# Patient Record
Sex: Female | Born: 1990 | Race: White | Hispanic: No | Marital: Married | Smoking: Never smoker
Health system: Southern US, Community
[De-identification: ages and names within clinical notes are randomized; demographics above are authoritative.]

## PROBLEM LIST (undated history)

## (undated) DIAGNOSIS — O1493 Unspecified pre-eclampsia, third trimester: Secondary | ICD-10-CM

## (undated) DIAGNOSIS — B999 Unspecified infectious disease: Secondary | ICD-10-CM

## (undated) DIAGNOSIS — Z8759 Personal history of other complications of pregnancy, childbirth and the puerperium: Secondary | ICD-10-CM

## (undated) DIAGNOSIS — F419 Anxiety disorder, unspecified: Secondary | ICD-10-CM

---

## 2019-08-12 ENCOUNTER — Inpatient Hospital Stay (HOSPITAL_COMMUNITY)
Admission: AD | Admit: 2019-08-12 | Discharge: 2019-08-13 | Disposition: A | Discharge status: Home / Self Care | Payer: Federal, State, Local not specified - PPO | Attending: Obstetrics and Gynecology | Admitting: Obstetrics and Gynecology

## 2019-08-12 ENCOUNTER — Encounter (HOSPITAL_COMMUNITY): Payer: Self-pay

## 2019-08-12 ENCOUNTER — Inpatient Hospital Stay (HOSPITAL_COMMUNITY): Payer: Federal, State, Local not specified - PPO

## 2019-08-12 ENCOUNTER — Other Ambulatory Visit: Payer: Self-pay

## 2019-08-12 DIAGNOSIS — O209 Hemorrhage in early pregnancy, unspecified: Secondary | ICD-10-CM | POA: Diagnosis present

## 2019-08-12 DIAGNOSIS — O418X10 Other specified disorders of amniotic fluid and membranes, first trimester, not applicable or unspecified: Secondary | ICD-10-CM

## 2019-08-12 DIAGNOSIS — O34219 Maternal care for unspecified type scar from previous cesarean delivery: Secondary | ICD-10-CM | POA: Diagnosis not present

## 2019-08-12 DIAGNOSIS — Z3A09 9 weeks gestation of pregnancy: Secondary | ICD-10-CM | POA: Diagnosis not present

## 2019-08-12 DIAGNOSIS — O468X1 Other antepartum hemorrhage, first trimester: Secondary | ICD-10-CM

## 2019-08-12 DIAGNOSIS — N939 Abnormal uterine and vaginal bleeding, unspecified: Secondary | ICD-10-CM

## 2019-08-12 HISTORY — DX: Unspecified infectious disease: B99.9

## 2019-08-12 HISTORY — DX: Anxiety disorder, unspecified: F41.9

## 2019-08-12 HISTORY — DX: Unspecified pre-eclampsia, third trimester: O14.93

## 2019-08-12 LAB — TYPE AND SCREEN
ABO/RH(D): O POS
Antibody Screen: NEGATIVE

## 2019-08-12 LAB — CBC WITH DIFFERENTIAL/PLATELET
Abs Immature Granulocytes: 0.05 10*3/uL (ref 0.00–0.07)
Basophils Absolute: 0 10*3/uL (ref 0.0–0.1)
Basophils Relative: 0 %
Eosinophils Absolute: 0.1 10*3/uL (ref 0.0–0.5)
Eosinophils Relative: 0 %
HCT: 39.6 % (ref 36.0–46.0)
Hemoglobin: 13.6 g/dL (ref 12.0–15.0)
Immature Granulocytes: 0 %
Lymphocytes Relative: 24 %
Lymphs Abs: 3.4 10*3/uL (ref 0.7–4.0)
MCH: 29.1 pg (ref 26.0–34.0)
MCHC: 34.3 g/dL (ref 30.0–36.0)
MCV: 84.6 fL (ref 80.0–100.0)
Monocytes Absolute: 1.1 10*3/uL — ABNORMAL HIGH (ref 0.1–1.0)
Monocytes Relative: 7 %
Neutro Abs: 9.6 10*3/uL — ABNORMAL HIGH (ref 1.7–7.7)
Neutrophils Relative %: 69 %
Platelets: 300 10*3/uL (ref 150–400)
RBC: 4.68 MIL/uL (ref 3.87–5.11)
RDW: 12.2 % (ref 11.5–15.5)
WBC: 14.2 10*3/uL — ABNORMAL HIGH (ref 4.0–10.5)
nRBC: 0 % (ref 0.0–0.2)

## 2019-08-12 LAB — WET PREP, GENITAL
Sperm: NONE SEEN
Trich, Wet Prep: NONE SEEN
Yeast Wet Prep HPF POC: NONE SEEN

## 2019-08-12 LAB — POCT PREGNANCY, URINE: Preg Test, Ur: POSITIVE — AB

## 2019-08-12 NOTE — MAU Provider Note (Signed)
History     [id number]  Arrival date and time: 08/12/19 2030    Chief Complaint  Patient presents with  . Vaginal Bleeding     HPI Laura Wilkerson is a 28 y.o. at 4578w0d by LMP, who presents for vaginal bleeding.   Around 11 AM had some spotting, light pink blood Also had some mild cramps Had intercouse with husband last night  Called OB who recommended monitoring as could be post coital Continued to have spotting throughout day as well as some cramps Unsure if she had any clots Denies vaginal discharge, odor No burning or pain w urination No fevers Mild nausea today but has had throughout this trimester, taking B6  Vaginal bleeding: Yes LOF: No Fetal Movement: No Contractions: No     OB History    Gravida  2   Para  1   Term  1   Preterm      AB      Living  1     SAB      TAB      Ectopic      Multiple      Live Births  1           Past Medical History:  Diagnosis Date  . Anxiety   . Infection    UTI  . Preeclampsia, third trimester     Past Surgical History:  Procedure Laterality Date  . CESAREAN SECTION      No family history on file.  Social History   Socioeconomic History  . Marital status: Married    Spouse name: Not on file  . Number of children: Not on file  . Years of education: Not on file  . Highest education level: Not on file  Occupational History  . Not on file  Social Needs  . Financial resource strain: Not on file  . Food insecurity    Worry: Not on file    Inability: Not on file  . Transportation needs    Medical: Not on file    Non-medical: Not on file  Tobacco Use  . Smoking status: Never Smoker  . Smokeless tobacco: Never Used  Substance and Sexual Activity  . Alcohol use: Never    Frequency: Never  . Drug use: Never  . Sexual activity: Yes    Comment: last sex 19 sep 20  Lifestyle  . Physical activity    Days per week: Not on file    Minutes per session: Not on file  . Stress: Not  on file  Relationships  . Social Musicianconnections    Talks on phone: Not on file    Gets together: Not on file    Attends religious service: Not on file    Active member of club or organization: Not on file    Attends meetings of clubs or organizations: Not on file    Relationship status: Not on file  . Intimate partner violence    Fear of current or ex partner: Not on file    Emotionally abused: Not on file    Physically abused: Not on file    Forced sexual activity: Not on file  Other Topics Concern  . Not on file  Social History Narrative  . Not on file    Not on File  No current facility-administered medications on file prior to encounter.    No current outpatient medications on file prior to encounter.     ROS Complete ROS completed and otherwise  negative except as noted in HPI  Physical Exam   BP (!) 127/93 (BP Location: Right Arm)   Pulse 68   Temp 97.8 F (36.6 C) (Oral)   Resp 18   LMP 06/10/2019 (Exact Date)   SpO2 100%   Physical Exam  Vitals reviewed. Constitutional: She appears well-developed and well-nourished. No distress.  Cardiovascular: Normal rate, regular rhythm and normal heart sounds.  No murmur heard. Respiratory: Effort normal and breath sounds normal. No respiratory distress. She has no wheezes. She has no rales.  GI: Soft. Bowel sounds are normal. She exhibits no distension. There is abdominal tenderness. There is no rebound and no guarding.  Mild tenderness to palpation in lower quadrants  Genitourinary:    Genitourinary Comments: SSE with visually closed cervix, scant brown blood in the vault, no exudate or erythema   Musculoskeletal:        General: No edema.  Neurological: She is alert. Coordination normal.  Skin: Skin is warm and dry. She is not diaphoretic.  Psychiatric: She has a normal mood and affect.    Bedside Ultrasound Not performed  US OB <14 Weeks CLINICAL DATA:  Spotting and cramping  EXAM: OBSTETRIC <14 WK [place] AND  TRANSVAGINAL OB US  TECHNIQUE: Both transabdominal and transvaginal ultrasound examinations were performed for complete evaluation of the gestation as well as the maternal uterus, adnexal regions, and pelvic cul-de-sac. Transvaginal technique was performed to assess early pregnancy.  COMPARISON:  None.  FINDINGS: Intrauterine gestational sac: Single  Yolk sac:  Visualized  Embryo:  Visualized  Cardiac Activity: Visualized  Heart Rate: 180 bpm  CRL: 271.1 mm   9 w   4 d                  [place] EDC: 03/12/2020  Subchorionic hemorrhage: Small subchorionic hemorrhage along the posterior sac.  Maternal uterus/adnexae: Ovaries are within normal limits. Left ovary measures 2.6 x 1.3 x 2.3 cm. Right ovary measures 3.1 x 2.3 x 2.8 cm. No significant free fluid  IMPRESSION: 1. Single viable intrauterine pregnancy as above. 2. Small subchorionic hemorrhage   Electronically Signed   By: Donavan Foil M.D.   On: 08/12/2019 23:10  Results for orders placed or performed during the hospital encounter of 08/12/19 (from the past 24 hour(s))  Pregnancy, urine POC     Status: Abnormal   Collection Time: 08/12/19  8:51 PM  Result Value Ref Range   Preg Test, Ur POSITIVE (A) NEGATIVE  Wet prep, genital     Status: Abnormal   Collection Time: 08/12/19 10:26 PM   Specimen: Vaginal  Result Value Ref Range   Yeast Wet Prep HPF POC NONE SEEN NONE SEEN   Trich, Wet Prep NONE SEEN NONE SEEN   Clue Cells Wet Prep HPF POC PRESENT (A) NONE SEEN   WBC, Wet Prep HPF POC FEW (A) NONE SEEN   Sperm NONE SEEN   CBC with Differential/Platelet     Status: Abnormal   Collection Time: 08/12/19 10:57 PM  Result Value Ref Range   WBC 14.2 (H) 4.0 - 10.5 K/uL   RBC 4.68 3.87 - 5.11 MIL/uL   Hemoglobin 13.6 12.0 - 15.0 g/dL   HCT 39.6 36.0 - 46.0 %   MCV 84.6 80.0 - 100.0 fL   MCH 29.1 26.0 - 34.0 pg   MCHC 34.3 30.0 - 36.0 g/dL   RDW 12.2 11.5 - 15.5 %   Platelets 300 150 - 400 K/uL    nRBC 0.0  0.0 - 0.2 %   Neutrophils Relative % 69 %   Neutro Abs 9.6 (H) 1.7 - 7.7 K/uL   Lymphocytes Relative 24 %   Lymphs Abs 3.4 0.7 - 4.0 K/uL   Monocytes Relative 7 %   Monocytes Absolute 1.1 (H) 0.1 - 1.0 K/uL   Eosinophils Relative 0 %   Eosinophils Absolute 0.1 0.0 - 0.5 K/uL   Basophils Relative 0 %   Basophils Absolute 0.0 0.0 - 0.1 K/uL   Immature Granulocytes 0 %   Abs Immature Granulocytes 0.05 0.00 - 0.07 K/uL  Type and screen [facility]     Status: None   Collection Time: 08/12/19 10:57 PM  Result Value Ref Range   ABO/RH(D) O POS    Antibody Screen NEG    Sample Expiration      08/15/2019,2359 Performed at [facility], [address] [place], [place] [address]   ABO/Rh     Status: None (Preliminary result)   Collection Time: 08/12/19 10:57 PM  Result Value Ref Range   ABO/RH(D)      O POS Performed at [facility], [address], [place], [place] [address]      MAU Course  Procedures OB US CBC, HCG, T&S, Wet Prep, GN/CT  MDM Moderate  Assessment and Plan  #First Trimester Bleeding #Subchorionic hemorrhage Patient presented with bleeding, cramping and pregnancy of unknown location. [place] reassuring with IUP and small subchorionic hemorrhage, patient familiar with condition and prognosis as same thing occurred with her first pregnancy. Blood type O+, rhogam not indicated. Wet prep notable for clue cells, will treat with flagyl. GN/CT swabs pending.  D/c to home in stable condition.   Venora Maples

## 2019-08-12 NOTE — MAU Note (Addendum)
Pt reports she is [redacted] weeks pregnant and started having light spotting and cramping that started this morning. Ate some chicken noodle soup and took a nap. Started having some light pink spotting and passed some tissue. Continues to have cramping. Called Dr Terri Piedra and was told to come in for evaluation

## 2019-08-13 LAB — HCG, QUANTITATIVE, PREGNANCY: hCG, Beta Chain, Quant, S: 157460 m[IU]/mL — ABNORMAL HIGH (ref <5)

## 2019-08-13 LAB — ABO/RH: ABO/RH(D): O POS

## 2019-08-13 MED ORDER — METRONIDAZOLE 500 MG PO TABS: 500.0000 mg | ORAL_TABLET | Freq: Two times a day (BID) | ORAL | 0 refills | Status: AC

## 2019-08-13 NOTE — Discharge Instructions (Signed)
Subchorionic Hematoma ° °A subchorionic hematoma is a gathering of blood between the outer wall of the embryo (chorion) and the inner wall of the womb (uterus). °This condition can cause vaginal bleeding. If they cause little or no vaginal bleeding, early small hematomas usually shrink on their own and do not affect your baby or pregnancy. When bleeding starts later in pregnancy, or if the hematoma is larger or occurs in older pregnant women, the condition may be more serious. Larger hematomas may get bigger, which increases the chances of miscarriage. This condition also increases the risk of: °· Premature separation of the placenta from the uterus. °· Premature (preterm) labor. °· Stillbirth. °What are the causes? °The exact cause of this condition is not known. It occurs when blood is trapped between the placenta and the uterine wall because the placenta has separated from the original site of implantation. °What increases the risk? °You are more likely to develop this condition if: °· You were treated with fertility medicines. °· You conceived through in vitro fertilization (IVF). °What are the signs or symptoms? °Symptoms of this condition include: °· Vaginal spotting or bleeding. °· Contractions of the uterus. These cause abdominal pain. °Sometimes you may have no symptoms and the bleeding may only be seen when ultrasound images are taken (transvaginal ultrasound). °How is this diagnosed? °This condition is diagnosed based on a physical exam. This includes a pelvic exam. You may also have other tests, including: °· Blood tests. °· Urine tests. °· Ultrasound of the abdomen. °How is this treated? °Treatment for this condition can vary. Treatment may include: °· Watchful waiting. You will be monitored closely for any changes in bleeding. During this stage: °? The hematoma may be reabsorbed by the body. °? The hematoma may separate the fluid-filled space containing the embryo (gestational sac) from the wall of the  womb (endometrium). °· Medicines. °· Activity restriction. This may be needed until the bleeding stops. °Follow these instructions at home: °· Stay on bed rest if told to do so by your health care provider. °· Do not lift anything that is heavier than 10 lbs. (4.5 kg) or as told by your health care provider. °· Do not use any products that contain nicotine or tobacco, such as cigarettes and e-cigarettes. If you need help quitting, ask your health care provider. °· Track and write down the number of pads you use each day and how soaked (saturated) they are. °· Do not use tampons. °· Keep all follow-up visits as told by your health care provider. This is important. Your health care provider may ask you to have follow-up blood tests or ultrasound tests or both. °Contact a health care provider if: °· You have any vaginal bleeding. °· You have a fever. °Get help right away if: °· You have severe cramps in your stomach, back, abdomen, or pelvis. °· You pass large clots or tissue. Save any tissue for your health care provider to look at. °· You have more vaginal bleeding, and you faint or become lightheaded or weak. °Summary °· A subchorionic hematoma is a gathering of blood between the outer wall of the placenta and the uterus. °· This condition can cause vaginal bleeding. °· Sometimes you may have no symptoms and the bleeding may only be seen when ultrasound images are taken. °· Treatment may include watchful waiting, medicines, or activity restriction. °This information is not intended to replace advice given to you by your health care provider. Make sure you discuss any questions you   have with your health care provider. °Document Released: 02/23/2007 Document Revised: 10/21/2017 Document Reviewed: 01/04/2017 °Elsevier Patient Education © 2020 [employer] Inc[employer] ° °

## 2019-08-14 LAB — CERVICOVAGINAL ANCILLARY ONLY
Chlamydia: NEGATIVE
Neisseria Gonorrhea: NEGATIVE

## 2019-08-22 LAB — OB RESULTS CONSOLE ABO/RH: RH Type: POSITIVE

## 2019-08-22 LAB — OB RESULTS CONSOLE GC/CHLAMYDIA
Chlamydia: NEGATIVE
Gonorrhea: NEGATIVE

## 2019-08-22 LAB — OB RESULTS CONSOLE HIV ANTIBODY (ROUTINE TESTING): HIV: NONREACTIVE

## 2019-08-22 LAB — OB RESULTS CONSOLE RUBELLA ANTIBODY, IGM: Rubella: IMMUNE

## 2019-08-22 LAB — OB RESULTS CONSOLE HEPATITIS B SURFACE ANTIGEN: Hepatitis B Surface Ag: NEGATIVE

## 2019-08-22 LAB — OB RESULTS CONSOLE ANTIBODY SCREEN: Antibody Screen: NEGATIVE

## 2019-08-22 LAB — OB RESULTS CONSOLE RPR: RPR: NONREACTIVE

## 2020-02-26 ENCOUNTER — Encounter (HOSPITAL_COMMUNITY): Payer: Self-pay

## 2020-02-29 ENCOUNTER — Telehealth (HOSPITAL_COMMUNITY): Payer: Self-pay | Admitting: *Deleted

## 2020-02-29 ENCOUNTER — Encounter (HOSPITAL_COMMUNITY): Payer: Self-pay

## 2020-02-29 NOTE — Patient Instructions (Signed)
Laura Wilkerson  02/29/2020   Your procedure is scheduled on:  03/10/2020  Arrive at 0530 at [employer] [facility] on [address] at [facility]  and [employer]. You are invited to use the FREE valet parking or use the Visitor's parking deck.  Pick up the phone at the desk and dial [phone].  Call this number if you have problems the morning of surgery: [phone]  Remember:   Do not eat food:(After Midnight) Desps de medianoche.  Do not drink clear liquids: (After Midnight) Desps de medianoche.  Take these medicines the morning of surgery with A SIP OF WATER:  none   Do not wear jewelry, make-up or nail polish.  Do not wear lotions, powders, or perfumes. Do not wear deodorant.  Do not shave 48 hours prior to surgery.  Do not bring valuables to the hospital.  [facility] is not   responsible for any belongings or valuables brought to the hospital.  Contacts, dentures or bridgework may not be worn into surgery.  Leave suitcase in the car. After surgery it may be brought to your room.  For patients admitted to the hospital, checkout time is 11:00 AM the day of              discharge.      Please read over the following fact sheets that you were given:     Preparing for Surgery

## 2020-02-29 NOTE — Telephone Encounter (Signed)
Preadmission screen  

## 2020-03-08 ENCOUNTER — Other Ambulatory Visit (HOSPITAL_COMMUNITY)
Admission: RE | Admit: 2020-03-08 | Discharge: 2020-03-08 | Disposition: A | Discharge status: Home / Self Care | Payer: Federal, State, Local not specified - PPO | Source: Ambulatory Visit | Attending: Obstetrics and Gynecology | Admitting: Obstetrics and Gynecology

## 2020-03-08 ENCOUNTER — Other Ambulatory Visit: Payer: Self-pay

## 2020-03-08 DIAGNOSIS — Z20822 Contact with and (suspected) exposure to covid-19: Secondary | ICD-10-CM | POA: Insufficient documentation

## 2020-03-08 HISTORY — DX: Personal history of other complications of pregnancy, childbirth and the puerperium: Z87.59

## 2020-03-08 LAB — CBC
HCT: 37.3 % (ref 36.0–46.0)
Hemoglobin: 11.5 g/dL — ABNORMAL LOW (ref 12.0–15.0)
MCH: 24.2 pg — ABNORMAL LOW (ref 26.0–34.0)
MCHC: 30.8 g/dL (ref 30.0–36.0)
MCV: 78.5 fL — ABNORMAL LOW (ref 80.0–100.0)
Platelets: 239 10*3/uL (ref 150–400)
RBC: 4.75 MIL/uL (ref 3.87–5.11)
RDW: 14.5 % (ref 11.5–15.5)
WBC: 9.1 10*3/uL (ref 4.0–10.5)
nRBC: 0 % (ref 0.0–0.2)

## 2020-03-08 LAB — TYPE AND SCREEN
ABO/RH(D): O POS
Antibody Screen: NEGATIVE

## 2020-03-08 LAB — RPR: RPR Ser Ql: NONREACTIVE

## 2020-03-08 LAB — SARS CORONAVIRUS 2 (TAT 6-24 HRS): SARS Coronavirus 2: NEGATIVE

## 2020-03-08 NOTE — MAU Note (Signed)
Pt here for covid swab and labs before section.  OR instructions reviewed, pt verbalized understanding.  No complaints at this time.

## 2020-03-08 NOTE — H&P (Signed)
Laura Wilkerson is a 29 y.o. female G2P1001 at 77 1/7 weeks (EDD 03/16/20 by LMP c/w 9 week [place]) presenting for scheduled repeat c-section.  Prenatal care significant for prior LTCS for arrest of descent and patinet declines TOL.  This pregnancy has been some concern for LGA and last [place] 02/13/20 showed baby at the 93%ile.   She had preeclampsia last pregnancy but has done well with baby ASA this pregnancy and baseline BP are 120-130/80-90 so may have borderline CHTN.  OB History    Gravida  2   Para  1   Term  1   Preterm      AB      Living  1     SAB      TAB      Ectopic      Multiple      Live Births  1         06-18-2015, 39 wks  F, 8lbs 2oz, Cesarean Section  Past Medical History:  Diagnosis Date  . Anxiety   . History of pre-eclampsia   . Infection    UTI  . Preeclampsia, third trimester    Past Surgical History:  Procedure Laterality Date  . CESAREAN SECTION     Family History: family history includes Thyroid disease in her mother. Social History:  reports that she has never smoked. She has never used smokeless tobacco. She reports that she does not drink alcohol or use drugs.     Maternal Diabetes: No Genetic Screening: Normal Maternal Ultrasounds/Referrals: Other:Possible LGA Fetal Ultrasounds or other Referrals:  None Maternal Substance Abuse:  No Significant Maternal Medications:  None Significant Maternal Lab Results:  None and Group B Strep negative Other Comments:  None  Review of Systems  Constitutional: Negative for fever.  Gastrointestinal: Negative for abdominal pain.  Neurological: Negative for headaches.   Maternal Medical History:  Contractions: Frequency: irregular.   Perceived severity is mild.    Fetal activity: Perceived fetal activity is normal.    Prenatal complications: H/o LTCS  Prenatal Complications - Diabetes: none.      Last menstrual period 06/10/2019. Maternal Exam:  Uterine Assessment: Contraction  strength is mild.  Contraction frequency is irregular.   Abdomen: Patient reports no abdominal tenderness. Fetal presentation: vertex  Introitus: Normal vulva. Normal vagina.    Physical Exam  Constitutional: She appears well-developed.  Cardiovascular: Normal rate and regular rhythm.  Respiratory: Effort normal.  GI: Soft.  Genitourinary:    Vulva and vagina normal.   Neurological: She is alert.  Psychiatric: She has a normal mood and affect.    Prenatal labs: ABO, Rh: --/--/O POS (04/17 8416) Antibody: NEG (04/17 0837) Rubella: Immune (09/30 0000) RPR: NON REACTIVE (04/17 0837)  HBsAg: Negative (09/30 0000)  HIV: Non-reactive (09/30 0000)  GBS:   Negative Panorama and Essential panel negative One hour GCT 131  Assessment/Plan: Repeat c-section procedure d/w pt in detail. Risks of bleeding, infection and possible damage to bowel and bladder d/w pt and recovery that would entail. Pt desires to proceed.   Oliver Pila 03/08/2020, 5:11 PM

## 2020-03-10 ENCOUNTER — Inpatient Hospital Stay (HOSPITAL_COMMUNITY)
Admission: AD | Admit: 2020-03-10 | Discharge: 2020-03-12 | DRG: 788 | Disposition: A | Discharge status: Home / Self Care | Payer: Federal, State, Local not specified - PPO | Attending: Obstetrics and Gynecology | Admitting: Obstetrics and Gynecology

## 2020-03-10 ENCOUNTER — Inpatient Hospital Stay (HOSPITAL_COMMUNITY): Payer: Federal, State, Local not specified - PPO

## 2020-03-10 ENCOUNTER — Encounter (HOSPITAL_COMMUNITY)
Admission: AD | Disposition: A | Discharge status: Home / Self Care | Payer: Self-pay | Source: Home / Self Care | Attending: Obstetrics and Gynecology

## 2020-03-10 ENCOUNTER — Encounter (HOSPITAL_COMMUNITY): Payer: Self-pay | Admitting: Obstetrics and Gynecology

## 2020-03-10 DIAGNOSIS — Z98891 History of uterine scar from previous surgery: Secondary | ICD-10-CM

## 2020-03-10 DIAGNOSIS — Z20822 Contact with and (suspected) exposure to covid-19: Secondary | ICD-10-CM | POA: Diagnosis present

## 2020-03-10 DIAGNOSIS — D649 Anemia, unspecified: Secondary | ICD-10-CM | POA: Diagnosis present

## 2020-03-10 DIAGNOSIS — O9902 Anemia complicating childbirth: Secondary | ICD-10-CM | POA: Diagnosis present

## 2020-03-10 DIAGNOSIS — Z3A39 39 weeks gestation of pregnancy: Secondary | ICD-10-CM | POA: Diagnosis not present

## 2020-03-10 DIAGNOSIS — O34211 Maternal care for low transverse scar from previous cesarean delivery: Principal | ICD-10-CM | POA: Diagnosis present

## 2020-03-10 DIAGNOSIS — O3663X0 Maternal care for excessive fetal growth, third trimester, not applicable or unspecified: Secondary | ICD-10-CM | POA: Diagnosis present

## 2020-03-10 SURGERY — Surgical Case
Anesthesia: Spinal

## 2020-03-10 MED ORDER — MENTHOL 3 MG MT LOZG: 1.0000 | LOZENGE | OROMUCOSAL | Status: DC | PRN

## 2020-03-10 MED ORDER — SODIUM CHLORIDE 0.9 % IV SOLN: INTRAVENOUS | Status: DC | PRN

## 2020-03-10 MED ORDER — DEXAMETHASONE SODIUM PHOSPHATE 10 MG/ML IJ SOLN
INTRAMUSCULAR | Status: DC | PRN
  Administered 2020-03-10: 10 mg via INTRAVENOUS

## 2020-03-10 MED ORDER — DIPHENHYDRAMINE HCL 25 MG PO CAPS
25.0000 mg | ORAL_CAPSULE | ORAL | Status: DC | PRN
  Filled 2020-03-10: qty 1

## 2020-03-10 MED ORDER — OXYTOCIN 40 UNITS IN NORMAL SALINE INFUSION - SIMPLE MED: 2.5000 [IU]/h | INTRAVENOUS | Status: AC

## 2020-03-10 MED ORDER — SODIUM CHLORIDE 0.9% FLUSH: 3.0000 mL | INTRAVENOUS | Status: DC | PRN

## 2020-03-10 MED ORDER — CEFAZOLIN SODIUM-DEXTROSE 2-3 GM-%(50ML) IV SOLR
INTRAVENOUS | Status: DC | PRN
  Administered 2020-03-10: 2 g via INTRAVENOUS

## 2020-03-10 MED ORDER — LACTATED RINGERS IV SOLN: INTRAVENOUS | Status: DC

## 2020-03-10 MED ORDER — MEPERIDINE HCL 25 MG/ML IJ SOLN: 6.2500 mg | INTRAMUSCULAR | Status: DC | PRN

## 2020-03-10 MED ORDER — ACETAMINOPHEN 325 MG PO TABS: 650.0000 mg | ORAL_TABLET | ORAL | Status: DC | PRN

## 2020-03-10 MED ORDER — NALBUPHINE HCL 10 MG/ML IJ SOLN: 5.0000 mg | INTRAMUSCULAR | Status: DC | PRN

## 2020-03-10 MED ORDER — LIDOCAINE HCL 1 % IJ SOLN
INTRAMUSCULAR | Status: AC
  Filled 2020-03-10: qty 20

## 2020-03-10 MED ORDER — ONDANSETRON HCL 4 MG/2ML IJ SOLN: 4.0000 mg | Freq: Three times a day (TID) | INTRAMUSCULAR | Status: DC | PRN

## 2020-03-10 MED ORDER — BUPIVACAINE IN DEXTROSE 0.75-8.25 % IT SOLN
INTRATHECAL | Status: DC | PRN
  Administered 2020-03-10: 1.6 mL via INTRATHECAL

## 2020-03-10 MED ORDER — COCONUT OIL OIL: 1.0000 "application " | TOPICAL_OIL | Status: DC | PRN

## 2020-03-10 MED ORDER — SCOPOLAMINE 1 MG/3DAYS TD PT72
MEDICATED_PATCH | TRANSDERMAL | Status: AC
  Filled 2020-03-10: qty 1

## 2020-03-10 MED ORDER — PHENYLEPHRINE HCL-NACL 20-0.9 MG/250ML-% IV SOLN
INTRAVENOUS | Status: DC | PRN
  Administered 2020-03-10: 60 ug/min via INTRAVENOUS
  Administered 2020-03-10: 30 ug/min via INTRAVENOUS

## 2020-03-10 MED ORDER — FENTANYL CITRATE (PF) 100 MCG/2ML IJ SOLN
INTRAMUSCULAR | Status: AC
  Filled 2020-03-10: qty 2

## 2020-03-10 MED ORDER — PROMETHAZINE HCL 25 MG/ML IJ SOLN: 6.2500 mg | INTRAMUSCULAR | Status: DC | PRN

## 2020-03-10 MED ORDER — TETANUS-DIPHTH-ACELL PERTUSSIS 5-2.5-18.5 LF-MCG/0.5 IM SUSP: 0.5000 mL | Freq: Once | INTRAMUSCULAR | Status: DC

## 2020-03-10 MED ORDER — PHENYLEPHRINE HCL-NACL 20-0.9 MG/250ML-% IV SOLN
INTRAVENOUS | Status: AC
  Filled 2020-03-10: qty 250

## 2020-03-10 MED ORDER — NALOXONE HCL 4 MG/10ML IJ SOLN
1.0000 ug/kg/h | INTRAVENOUS | Status: DC | PRN
  Filled 2020-03-10: qty 5

## 2020-03-10 MED ORDER — SCOPOLAMINE 1 MG/3DAYS TD PT72
1.0000 | MEDICATED_PATCH | Freq: Once | TRANSDERMAL | Status: DC
  Administered 2020-03-10: 1.5 mg via TRANSDERMAL
  Filled 2020-03-10: qty 1

## 2020-03-10 MED ORDER — OXYCODONE HCL 5 MG PO TABS: 5.0000 mg | ORAL_TABLET | ORAL | Status: DC | PRN

## 2020-03-10 MED ORDER — OXYCODONE HCL 5 MG/5ML PO SOLN: 5.0000 mg | Freq: Once | ORAL | Status: DC | PRN

## 2020-03-10 MED ORDER — SODIUM CHLORIDE 0.9 % IR SOLN
Status: DC | PRN
  Administered 2020-03-10: 1

## 2020-03-10 MED ORDER — SIMETHICONE 80 MG PO CHEW
80.0000 mg | CHEWABLE_TABLET | ORAL | Status: DC
  Administered 2020-03-10 – 2020-03-11 (×2): 80 mg via ORAL
  Filled 2020-03-10 (×2): qty 1

## 2020-03-10 MED ORDER — HYDROMORPHONE HCL 1 MG/ML IJ SOLN: 0.2500 mg | INTRAMUSCULAR | Status: DC | PRN

## 2020-03-10 MED ORDER — MORPHINE SULFATE (PF) 0.5 MG/ML IJ SOLN
INTRAMUSCULAR | Status: AC
  Filled 2020-03-10: qty 10

## 2020-03-10 MED ORDER — DIPHENHYDRAMINE HCL 50 MG/ML IJ SOLN: 12.5000 mg | INTRAMUSCULAR | Status: DC | PRN

## 2020-03-10 MED ORDER — ZOLPIDEM TARTRATE 5 MG PO TABS: 5.0000 mg | ORAL_TABLET | Freq: Every evening | ORAL | Status: DC | PRN

## 2020-03-10 MED ORDER — OXYCODONE HCL 5 MG PO TABS: 5.0000 mg | ORAL_TABLET | Freq: Once | ORAL | Status: DC | PRN

## 2020-03-10 MED ORDER — KETOROLAC TROMETHAMINE 30 MG/ML IJ SOLN
INTRAMUSCULAR | Status: AC
  Filled 2020-03-10: qty 1

## 2020-03-10 MED ORDER — OXYTOCIN 40 UNITS IN NORMAL SALINE INFUSION - SIMPLE MED
INTRAVENOUS | Status: DC | PRN
  Administered 2020-03-10: 40 [IU] via INTRAVENOUS

## 2020-03-10 MED ORDER — NALBUPHINE HCL 10 MG/ML IJ SOLN: 5.0000 mg | Freq: Once | INTRAMUSCULAR | Status: DC | PRN

## 2020-03-10 MED ORDER — IBUPROFEN 800 MG PO TABS
800.0000 mg | ORAL_TABLET | Freq: Three times a day (TID) | ORAL | Status: DC
  Administered 2020-03-10 – 2020-03-12 (×6): 800 mg via ORAL
  Filled 2020-03-10 (×6): qty 1

## 2020-03-10 MED ORDER — DIPHENHYDRAMINE HCL 25 MG PO CAPS: 25.0000 mg | ORAL_CAPSULE | Freq: Four times a day (QID) | ORAL | Status: DC | PRN

## 2020-03-10 MED ORDER — MORPHINE SULFATE (PF) 0.5 MG/ML IJ SOLN
INTRAMUSCULAR | Status: DC | PRN
  Administered 2020-03-10: 150 ug via INTRATHECAL

## 2020-03-10 MED ORDER — STERILE WATER FOR IRRIGATION IR SOLN
Status: DC | PRN
  Administered 2020-03-10: 1000 mL

## 2020-03-10 MED ORDER — ONDANSETRON HCL 4 MG/2ML IJ SOLN
INTRAMUSCULAR | Status: AC
  Filled 2020-03-10: qty 2

## 2020-03-10 MED ORDER — FENTANYL CITRATE (PF) 100 MCG/2ML IJ SOLN
INTRAMUSCULAR | Status: DC | PRN
  Administered 2020-03-10: 15 ug via INTRATHECAL

## 2020-03-10 MED ORDER — PRENATAL MULTIVITAMIN CH
1.0000 | ORAL_TABLET | Freq: Every day | ORAL | Status: DC
  Administered 2020-03-11: 1 via ORAL
  Filled 2020-03-10: qty 1

## 2020-03-10 MED ORDER — DIBUCAINE (PERIANAL) 1 % EX OINT: 1.0000 "application " | TOPICAL_OINTMENT | CUTANEOUS | Status: DC | PRN

## 2020-03-10 MED ORDER — CEFAZOLIN SODIUM-DEXTROSE 2-4 GM/100ML-% IV SOLN
INTRAVENOUS | Status: AC
  Filled 2020-03-10: qty 100

## 2020-03-10 MED ORDER — OXYTOCIN 40 UNITS IN NORMAL SALINE INFUSION - SIMPLE MED
INTRAVENOUS | Status: AC
  Filled 2020-03-10: qty 1000

## 2020-03-10 MED ORDER — WITCH HAZEL-GLYCERIN EX PADS: 1.0000 "application " | MEDICATED_PAD | CUTANEOUS | Status: DC | PRN

## 2020-03-10 MED ORDER — KETOROLAC TROMETHAMINE 30 MG/ML IJ SOLN
30.0000 mg | Freq: Once | INTRAMUSCULAR | Status: AC | PRN
  Administered 2020-03-10: 30 mg via INTRAVENOUS

## 2020-03-10 MED ORDER — NALOXONE HCL 0.4 MG/ML IJ SOLN: 0.4000 mg | INTRAMUSCULAR | Status: DC | PRN

## 2020-03-10 MED ORDER — CEFAZOLIN SODIUM-DEXTROSE 2-4 GM/100ML-% IV SOLN: 2.0000 g | INTRAVENOUS | Status: DC

## 2020-03-10 MED ORDER — SIMETHICONE 80 MG PO CHEW
80.0000 mg | CHEWABLE_TABLET | Freq: Three times a day (TID) | ORAL | Status: DC
  Administered 2020-03-10 – 2020-03-12 (×5): 80 mg via ORAL
  Filled 2020-03-10 (×5): qty 1

## 2020-03-10 MED ORDER — SENNOSIDES-DOCUSATE SODIUM 8.6-50 MG PO TABS
2.0000 | ORAL_TABLET | ORAL | Status: DC
  Administered 2020-03-10 – 2020-03-11 (×2): 2 via ORAL
  Filled 2020-03-10 (×2): qty 2

## 2020-03-10 MED ORDER — SIMETHICONE 80 MG PO CHEW: 80.0000 mg | CHEWABLE_TABLET | ORAL | Status: DC | PRN

## 2020-03-10 MED ORDER — ONDANSETRON HCL 4 MG/2ML IJ SOLN
INTRAMUSCULAR | Status: DC | PRN
  Administered 2020-03-10: 4 mg via INTRAVENOUS

## 2020-03-10 SURGICAL SUPPLY — 36 items
BENZOIN TINCTURE PRP APPL 2/3 (GAUZE/BANDAGES/DRESSINGS) ×3 IMPLANT
CHLORAPREP W/TINT 26ML (MISCELLANEOUS) ×3 IMPLANT
CLAMP CORD UMBIL (MISCELLANEOUS) IMPLANT
CLOSURE WOUND 1/2 X4 (GAUZE/BANDAGES/DRESSINGS)
CLOTH BEACON ORANGE TIMEOUT ST (SAFETY) ×3 IMPLANT
DRSG OPSITE POSTOP 4X10 (GAUZE/BANDAGES/DRESSINGS) ×3 IMPLANT
ELECT REM PT RETURN 9FT ADLT (ELECTROSURGICAL) ×3
ELECTRODE REM PT RTRN 9FT ADLT (ELECTROSURGICAL) ×1 IMPLANT
EXTRACTOR VACUUM KIWI (MISCELLANEOUS) IMPLANT
GLOVE BIO SURGEON STRL SZ 6.5 (GLOVE) ×2 IMPLANT
GLOVE BIO SURGEONS STRL SZ 6.5 (GLOVE) ×1
GLOVE BIOGEL PI IND STRL 7.0 (GLOVE) ×1 IMPLANT
GLOVE BIOGEL PI INDICATOR 7.0 (GLOVE) ×2
GOWN STRL REUS W/TWL LRG LVL3 (GOWN DISPOSABLE) ×6 IMPLANT
KIT ABG SYR 3ML LUER SLIP (SYRINGE) IMPLANT
NEEDLE HYPO 25X5/8 SAFETYGLIDE (NEEDLE) IMPLANT
NS IRRIG 1000ML POUR BTL (IV SOLUTION) ×3 IMPLANT
PACK C SECTION WH (CUSTOM PROCEDURE TRAY) ×3 IMPLANT
PAD OB MATERNITY 4.3X12.25 (PERSONAL CARE ITEMS) ×3 IMPLANT
PENCIL SMOKE EVAC W/HOLSTER (ELECTROSURGICAL) ×3 IMPLANT
RTRCTR C-SECT PINK 25CM LRG (MISCELLANEOUS) ×3 IMPLANT
STRIP CLOSURE SKIN 1/2X4 (GAUZE/BANDAGES/DRESSINGS) IMPLANT
STRIP SURGICAL 1 X 6 IN (GAUZE/BANDAGES/DRESSINGS) ×3 IMPLANT
SUT CHROMIC 1 CTX 36 (SUTURE) ×6 IMPLANT
SUT PLAIN 0 NONE (SUTURE) IMPLANT
SUT PLAIN 2 0 XLH (SUTURE) ×3 IMPLANT
SUT VIC AB 0 CT1 27 (SUTURE) ×4
SUT VIC AB 0 CT1 27XBRD ANBCTR (SUTURE) ×2 IMPLANT
SUT VIC AB 2-0 CT1 27 (SUTURE) ×4
SUT VIC AB 2-0 CT1 TAPERPNT 27 (SUTURE) ×2 IMPLANT
SUT VIC AB 3-0 CT1 27 (SUTURE)
SUT VIC AB 3-0 CT1 TAPERPNT 27 (SUTURE) IMPLANT
SUT VIC AB 4-0 KS 27 (SUTURE) ×3 IMPLANT
TOWEL OR 17X24 6PK STRL BLUE (TOWEL DISPOSABLE) ×3 IMPLANT
TRAY FOLEY W/BAG SLVR 14FR LF (SET/KITS/TRAYS/PACK) ×3 IMPLANT
WATER STERILE IRR 1000ML POUR (IV SOLUTION) ×3 IMPLANT

## 2020-03-10 NOTE — Anesthesia Preprocedure Evaluation (Signed)
Anesthesia Evaluation  Patient identified by MRN, date of birth, ID band Patient awake    Reviewed: Allergy & Precautions, NPO status , Patient's Chart, lab work & pertinent test results  Airway Mallampati: II  TM Distance: >3 FB Neck ROM: Full    Dental no notable dental hx.    Pulmonary neg pulmonary ROS,    Pulmonary exam normal breath sounds clear to auscultation       Cardiovascular hypertension, negative cardio ROS Normal cardiovascular exam Rhythm:Regular Rate:Normal     Neuro/Psych Anxiety negative neurological ROS  negative psych ROS   GI/Hepatic negative GI ROS, Neg liver ROS,   Endo/Other  negative endocrine ROS  Renal/GU negative Renal ROS  negative genitourinary   Musculoskeletal negative musculoskeletal ROS (+)   Abdominal (+) + obese,   Peds negative pediatric ROS (+)  Hematology negative hematology ROS (+)   Anesthesia Other Findings   Reproductive/Obstetrics (+) Pregnancy                             Anesthesia Physical Anesthesia Plan  ASA: II  Anesthesia Plan: Spinal   Post-op Pain Management:    Induction:   PONV Risk Score and Plan: 2 and Treatment may vary due to age or medical condition  Airway Management Planned: Natural Airway  Additional Equipment:   Intra-op Plan:   Post-operative Plan:   Informed Consent: I have reviewed the patients History and Physical, chart, labs and discussed the procedure including the risks, benefits and alternatives for the proposed anesthesia with the patient or authorized representative who has indicated his/her understanding and acceptance.     Dental advisory given  Plan Discussed with: CRNA  Anesthesia Plan Comments:         Anesthesia Quick Evaluation

## 2020-03-10 NOTE — Anesthesia Postprocedure Evaluation (Signed)
Anesthesia Post Note  Patient: Ladona Ridgel Pruitte Willett  Procedure(s) Performed: CESAREAN SECTION (N/A )     Patient location during evaluation: PACU Anesthesia Type: Spinal Level of consciousness: awake and alert Pain management: pain level controlled Vital Signs Assessment: post-procedure vital signs reviewed and stable Respiratory status: spontaneous breathing, nonlabored ventilation and respiratory function stable Cardiovascular status: blood pressure returned to baseline and stable Postop Assessment: no apparent nausea or vomiting Anesthetic complications: no    Last Vitals:  Vitals:   03/10/20 1000 03/10/20 1007  BP: 133/78 (!) 142/84  Pulse: 60 77  Resp: 15 16  Temp:  36.7 C  SpO2: 99% 98%    Last Pain:  Vitals:   03/10/20 1007  TempSrc:   PainSc: 0-No pain   Pain Goal:                Epidural/Spinal Function Cutaneous sensation: Able to Discern Pressure (03/10/20 1007), Patient able to flex knees: Yes (03/10/20 1007), Patient able to lift hips off bed: Yes (03/10/20 1007), Back pain beyond tenderness at insertion site: No (03/10/20 1007), Progressively worsening motor and/or sensory loss: No (03/10/20 1007), Bowel and/or bladder incontinence post epidural: No (03/10/20 1007)  Lowella Curb

## 2020-03-10 NOTE — Op Note (Signed)
Operative Note    Preoperative Diagnosis Term pregnancy at 39+ weeks  Prior C-section x 1, declines TOL Suspected LGA  Postoperative Diagnosis same  Procedure Repeat C-section with two layer closure of uterus  Surgeon Huel Cote, MD Webb Silversmith, RNFA  Anesthesia Spinal  Fluids: EBL UOP clear IVF LR  Findings Viable female infant in the vertex presentation, required vacuum assistance to deliver head. Apgars 9,9 Weight pending  Uterus normal as well as tubes and ovaries except for adhesions of anterior abdominal wall and omentum to fundus.    Specimen Placenta to L&D  Procedure Note Patient was taken to the operating room where spinal anesthesia was obtained with some difficulty , but then found to be adequate by Allis clamp test. She was prepped and draped in the normal sterile fashion in the dorsal supine position with a leftward tilt. An appropriate time out was performed. A Pfannenstiel skin incision was then made through a pre-existing scar with the scalpel and carried through to the underlying layer of fascia by sharp dissection and Bovie cautery. The fascia was nicked in the midline and the incision was extended laterally with Mayo scissors. The inferior aspect of the incision was grasped Coker clamps and dissected off the underlying rectus muscles. In a similar fashion the superior aspect was dissected off the rectus muscles. Rectus muscles were separated in the midline and the peritoneal cavity entered bluntly. The peritoneal incision was then extended both superiorly and inferiorly but did require lysis of adhesions of abdominal wall to fundus and omental adhesions also taken down with careful attention to avoid both bowel and bladder. The Alexis self-retaining wound retractor was then placed within the incision and the lower uterine segment exposed. The bladder flap was developed with Metzenbaum scissors and pushed away from the lower  uterine segment. The lower uterine segment was then incised in a transverse fashion and the cavity itself entered bluntly. The incision was extended bluntly. The infant's head was then lifted and delivered to the incision and could not be expelled with fundal pressure.  The softcup vacuum was then applied in the green zone and was delivered in two pulls with one popoff.  The remainder of the infant delivered and the nose and mouth bulb suctioned with the cord clamped and cut as well. The infant was handed off to the waiting pediatricians. The placenta was then spontaneously expressed from the uterus and the uterus cleared of all clots and debris with moist lap sponge. The uterine incision was then repaired in 2 layers the first layer was a running locked layer of 1-0 chromic and the second an imbricating layer of the same suture. The tubes and ovaries were inspected and the gutters cleared of all clots and debris. The uterine incision was inspected and found to be hemostatic. All instruments and sponges as well as the Alexis retractor were then removed from the abdomen. The rectus muscles and peritoneum were then reapproximated with a running suture of 2-0 Vicryl. The fascia was then closed with 0 Vicryl in a running fashion. Subcutaneous tissue was reapproximated with 3-0 plain in a running fashion. The skin was closed with a subcuticular stitch of 4-0 Vicryl on a Keith needle and then reinforced with benzoin and Steri-Strips. At the conclusion of the procedure all instruments and sponge counts were correct. Patient was taken to the recovery room in good condition with her baby accompanying her skin to skin.

## 2020-03-10 NOTE — Progress Notes (Signed)
Patient ID: Laura Wilkerson, female   DOB: 01/22/1991, 29 y.o.   MRN: [id number] Per pt no changes in dictated H&P.  Brief exam WNL.  BP slightly elevated at 136/95, but no PIH sx and not uncommon for pt to run a bit high when nervous. Will follow BP intraoperatively to see if any further w/u necessary. Pt ready to proceed.

## 2020-03-10 NOTE — Transfer of Care (Signed)
Immediate Anesthesia Transfer of Care Note  Patient: Laura Wilkerson  Procedure(s) Performed: CESAREAN SECTION (N/A )  Patient Location: PACU  Anesthesia Type:Spinal  Level of Consciousness: awake  Airway & Oxygen Therapy: Patient Spontanous Breathing  Post-op Assessment: Report given to RN  Post vital signs: Reviewed and stable  Last Vitals:  Vitals Value Taken Time  BP    Temp    Pulse 73 03/10/20 0903  Resp 20 03/10/20 0903  SpO2 100 % 03/10/20 0903  Vitals shown include unvalidated device data.  Last Pain:  Vitals:   03/10/20 0543  TempSrc: Oral  PainSc: 0-No pain         Complications: No apparent anesthesia complications

## 2020-03-10 NOTE — Anesthesia Procedure Notes (Signed)
Spinal  Patient location during procedure: OB Start time: 03/10/2020 7:20 AM End time: 03/10/2020 7:38 AM Staffing Performed: anesthesiologist  Anesthesiologist: Lowella Curb, MD Preanesthetic Checklist Completed: patient identified, IV checked, risks and benefits discussed, surgical consent, monitors and equipment checked, pre-op evaluation and timeout performed Spinal Block Patient position: sitting Prep: DuraPrep and site prepped and draped Patient monitoring: heart rate, cardiac monitor, continuous pulse ox and blood pressure Approach: midline Location: L3-4 Injection technique: single-shot Needle Needle type: Quincke  Needle gauge: 22 G Needle length: 15 cm Assessment Sensory level: T4 Additional Notes Difficult spinal.  SRNA with several tries.  Several tries by me then switched to 15cm 22guage Quincke.  CSF return several centimeters to right of where spine was palpated to be.

## 2020-03-11 ENCOUNTER — Encounter: Payer: Self-pay | Admitting: *Deleted

## 2020-03-11 LAB — CBC
HCT: 31.4 % — ABNORMAL LOW (ref 36.0–46.0)
Hemoglobin: 9.7 g/dL — ABNORMAL LOW (ref 12.0–15.0)
MCH: 24.4 pg — ABNORMAL LOW (ref 26.0–34.0)
MCHC: 30.9 g/dL (ref 30.0–36.0)
MCV: 79.1 fL — ABNORMAL LOW (ref 80.0–100.0)
Platelets: 223 10*3/uL (ref 150–400)
RBC: 3.97 MIL/uL (ref 3.87–5.11)
RDW: 14.5 % (ref 11.5–15.5)
WBC: 16.1 10*3/uL — ABNORMAL HIGH (ref 4.0–10.5)
nRBC: 0 % (ref 0.0–0.2)

## 2020-03-11 LAB — BIRTH TISSUE RECOVERY COLLECTION (PLACENTA DONATION)

## 2020-03-11 MED ORDER — HYDROCORTISONE 1 % EX CREA
TOPICAL_CREAM | CUTANEOUS | Status: DC | PRN
  Filled 2020-03-11: qty 28

## 2020-03-11 NOTE — Progress Notes (Signed)
CSW received consult for hx of Anxiety.  CSW met with MOB to offer support and complete assessment.    CSW congratulated MOB and FOB on the birth of infant. CSW advised of CSW's role and the reason for CSW coming to visit with her. MOB reports that she was diagnosed with anxiety in 2015. MOB report being on medications na in therapy at that time. MOB expressed that she stopped her medications once she conceived her first child. MOB reports no current desire of need for medication or therapy. CSW understanding of this. MOB also reported that she has felt much better since giving birth this time. MOB denies SI and HI and reports that she has all items to care for infant.   MOB denies having any other mental health diagnosis and reports no other needs to CSW. Infant will be seen at [facility] in [place] for follow up care.   CSW provided education regarding the baby blues period vs. perinatal mood disorders, discussed treatment and gave resources for mental health follow up if concerns arise.  CSW recommends self-evaluation during the postpartum time period using the New Mom Checklist from Postpartum Progress and encouraged MOB to contact a medical professional if symptoms are noted at any time.   CSW provided review of Sudden Infant Death Syndrome (SIDS) precautions.   CSW identifies no further need for intervention and no barriers to discharge at this time.   Laura Wilkerson, MSW, LCSW [facility] [place] [phone]   

## 2020-03-11 NOTE — Progress Notes (Signed)
Subjective: Postpartum Day 1: Cesarean Delivery Patient reports incisional pain, tolerating PO and no problems voiding.    Objective: Vital signs in last 24 hours: Temp:  [97.7 F (36.5 C)-98.4 F (36.9 C)] 97.8 F (36.6 C) (04/20 0500) Pulse Rate:  [50-78] 57 (04/20 0500) Resp:  [15-20] 18 (04/20 0500) BP: (108-144)/(70-85) 108/71 (04/20 0500) SpO2:  [98 %-100 %] 100 % (04/20 0500)  Physical Exam:  General: alert and no distress Lochia: appropriate Uterine Fundus: firm Incision: healing well DVT Evaluation: No evidence of DVT seen on physical exam.  Recent Labs    03/08/20 0837 03/11/20 0548  HGB 11.5* 9.7*  HCT 37.3 31.4*    Assessment/Plan: Status post Cesarean section. Doing well postoperatively.  Continue current care.  Routine PP care.    Shalaya Swailes Bovard-Stuckert 03/11/2020, 7:43 AM

## 2020-03-12 MED ORDER — IBUPROFEN 800 MG PO TABS: 800.0000 mg | ORAL_TABLET | Freq: Three times a day (TID) | ORAL | 0 refills | Status: AC

## 2020-03-12 MED ORDER — OXYCODONE HCL 5 MG PO TABS: 5.0000 mg | ORAL_TABLET | ORAL | 0 refills | Status: DC | PRN

## 2020-03-12 NOTE — Discharge Summary (Signed)
OB Discharge Summary     Patient Name: Laura Wilkerson DOB: June 06, 1991 MRN: [id number]  Date of admission: 03/10/2020 Delivering MD: Paula Compton   Date of discharge: 03/12/2020  Admitting diagnosis: Status post repeat low transverse cesarean section [Z98.891] Intrauterine pregnancy: Unknown     Secondary diagnosis:  Active Problems:   Status post repeat low transverse cesarean section  Additional problems: h/o postpartum PreE     Discharge diagnosis: Term Pregnancy Delivered and repeat LTCS                                                                                                Post partum procedures:none  Complications: None  Hospital course:  Sceduled C/S   29 y.o. yo G2P1002 at Unknown was admitted to the hospital 03/10/2020 for scheduled cesarean section with the following indication:Elective Repeat.  Membrane Rupture Time/Date: 8:06 AM ,03/10/2020   Patient delivered a Viable infant.03/10/2020  Details of operation can be found in separate operative note.  Pateint had an uncomplicated postpartum course.  She is ambulating, tolerating a regular diet, passing flatus, and urinating well. Patient is discharged home in stable condition on  03/12/20         Physical exam  Vitals:   03/11/20 0500 03/11/20 1457 03/11/20 2116 03/12/20 0511  BP: 108/71 118/64 120/72 125/81  Pulse: (!) 57 65 60 (!) 59  Resp: 18 18 17 18   Temp: 97.8 F (36.6 C) 98.2 F (36.8 C) 97.7 F (36.5 C) 97.9 F (36.6 C)  TempSrc: Oral Oral Oral Oral  SpO2: 100%  100% 99%  Weight:      Height:       General: alert, cooperative and no distress Lochia: appropriate Uterine Fundus: firm Incision: Healing well with no significant drainage, No significant erythema, Dressing is clean, dry, and intact DVT Evaluation: No evidence of DVT seen on physical exam. Negative Homan's sign. No cords or calf tenderness. Labs: Lab Results  Component Value Date   WBC 16.1 (H) 03/11/2020   HGB 9.7  (L) 03/11/2020   HCT 31.4 (L) 03/11/2020   MCV 79.1 (L) 03/11/2020   PLT 223 03/11/2020   No flowsheet data found.  Discharge instruction: per After Visit Summary and "Baby and Me Booklet".  After visit meds:  Allergies as of 03/12/2020   No Known Allergies     Medication List    STOP taking these medications   aspirin EC 81 MG tablet     TAKE these medications   ibuprofen 800 MG tablet Commonly known as: ADVIL Take 1 tablet (800 mg total) by mouth every 8 (eight) hours.   oxyCODONE 5 MG immediate release tablet Commonly known as: Oxy IR/ROXICODONE Take 1-2 tablets (5-10 mg total) by mouth every 4 (four) hours as needed for moderate pain.   prenatal multivitamin Tabs tablet Take 1 tablet by mouth daily at 12 noon.       Diet: routine diet  Activity: Advance as tolerated. Pelvic rest for 6 weeks.   Outpatient follow up:2 and 6wks Follow up Appt:No future appointments. Follow up Visit:No follow-ups on file.  Postpartum contraception: IUD Mirena  Newborn Data: Live born female  Birth Weight: 9 lb 11 oz (4395 g) APGAR: 9, 9  Newborn Delivery   Birth date/time: 03/10/2020 08:10:00 Delivery type: C-Section, Vacuum Assisted Trial of labor: No C-section categorization: Repeat      Baby Feeding: Bottle Disposition:home with mother   03/12/2020 Carlisle Cater, MD

## 2020-03-12 NOTE — Progress Notes (Signed)
Subjective: Postpartum Day 2: Cesarean Delivery Patient reports incisional pain, tolerating PO and no problems voidingPain improved from yesterday, passing flatus, VB slowing to minimal. Bottle feeding. Desires Mirena IUD at postpartum.    Objective: Vital signs in last 24 hours: Temp:  [97.7 F (36.5 C)-98.2 F (36.8 C)] 97.9 F (36.6 C) (04/21 0511) Pulse Rate:  [59-65] 59 (04/21 0511) Resp:  [17-18] 18 (04/21 0511) BP: (118-125)/(64-81) 125/81 (04/21 0511) SpO2:  [99 %-100 %] 99 % (04/21 0511)  Physical Exam:  General: alert and no distress Lochia: appropriate Uterine Fundus: firm Incision: healing well DVT Evaluation: No evidence of DVT seen on physical exam.  Recent Labs    03/11/20 0548  HGB 9.7*  HCT 31.4*    Assessment/Plan: Status post Cesarean section. Doing well postoperatively.  Continue current care.  DC home today, spirometer to take home. Mild anemia - asx, send home on QD iron   Laura Wilkerson 03/12/2020, 10:09 AM

## 2020-06-22 IMAGING — US US OB < 14 WEEKS - US OB TV
1 series · 15 of 20 positions shown · non-contrast
Comparison: None.

CLINICAL DATA: Spotting and cramping

EXAM:
OBSTETRIC <14 WK US AND TRANSVAGINAL OB US
TECHNIQUE: Both transabdominal and transvaginal ultrasound examinations were
performed for complete evaluation of the gestation as well as the
maternal uterus, adnexal regions, and pelvic cul-de-sac.
Transvaginal technique was performed to assess early pregnancy.

[Series 1: us ob < 14 weeks - us ob tv · 15 of 20 slices shown]
[im 1/20]
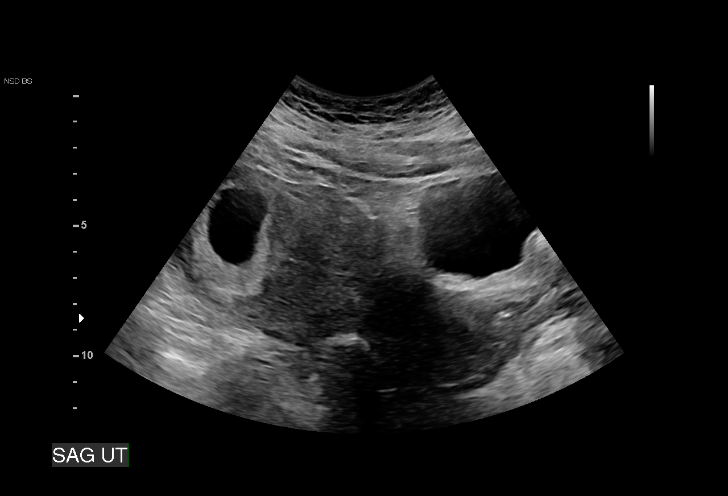
[im 3/20]
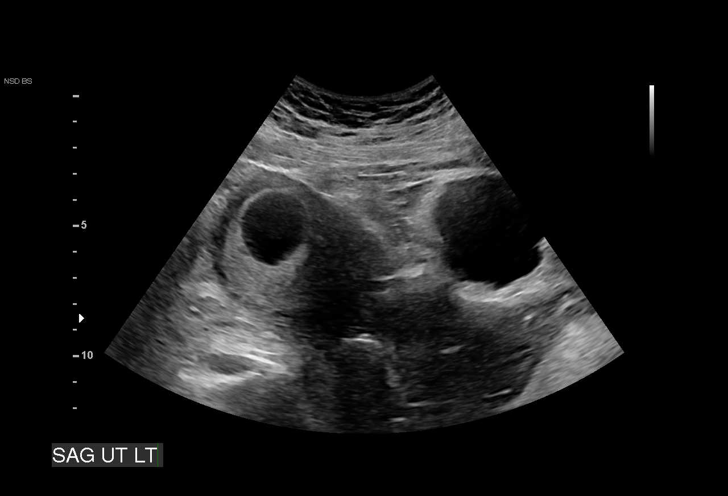
[im 4/20]
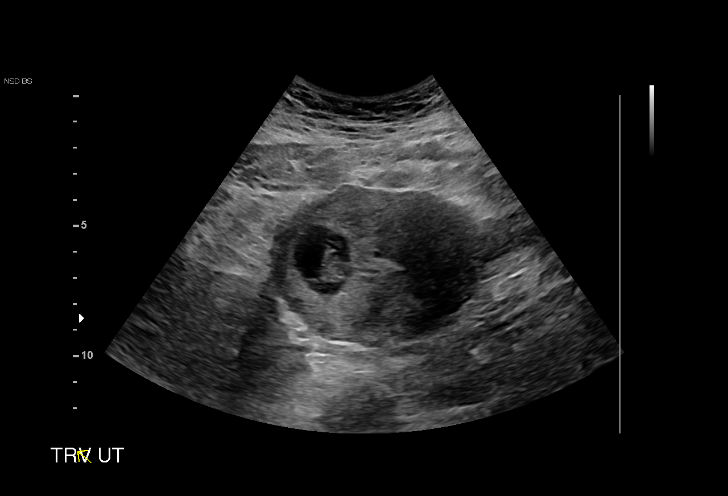
[im 5/20]
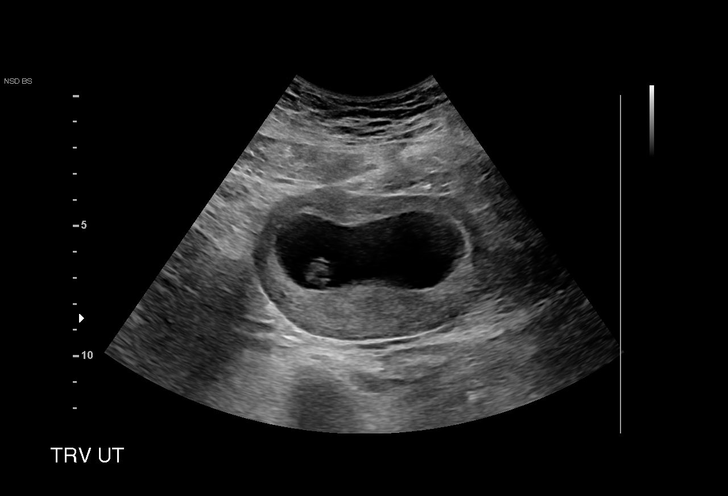
[im 7/20]
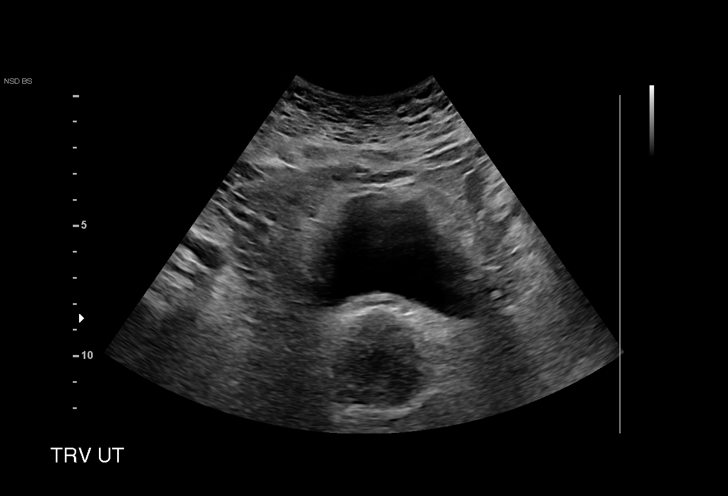
[im 8/20]
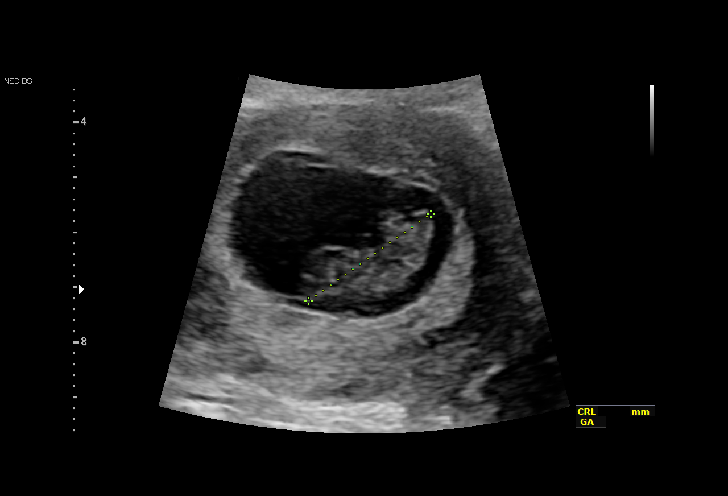
[im 9/20]
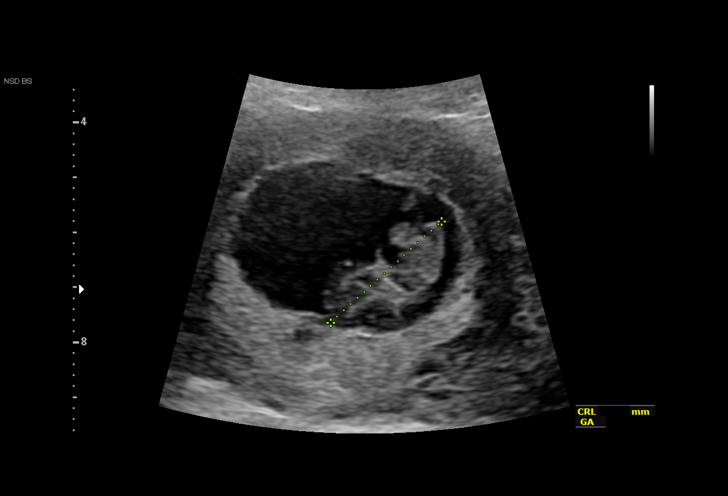
[im 11/20]
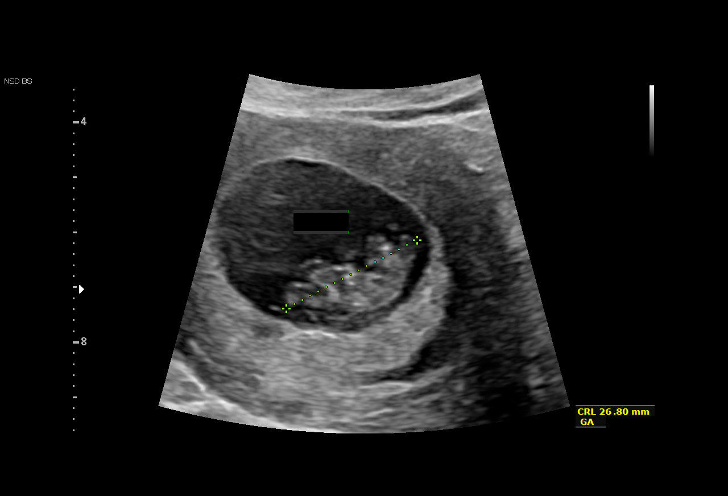
[im 12/20]
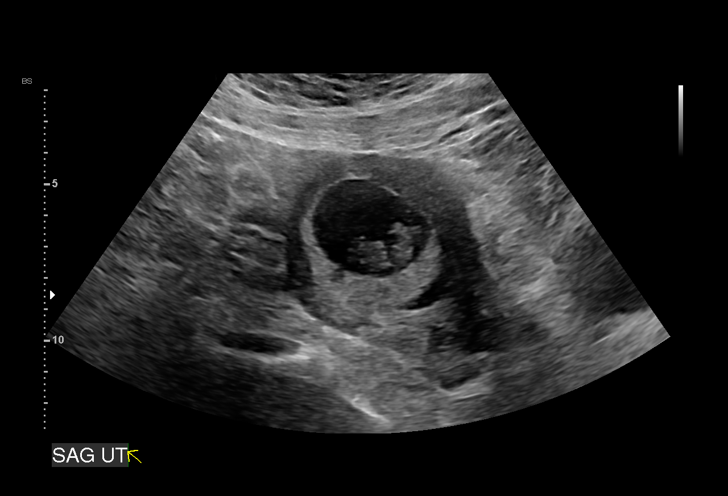
[im 13/20]
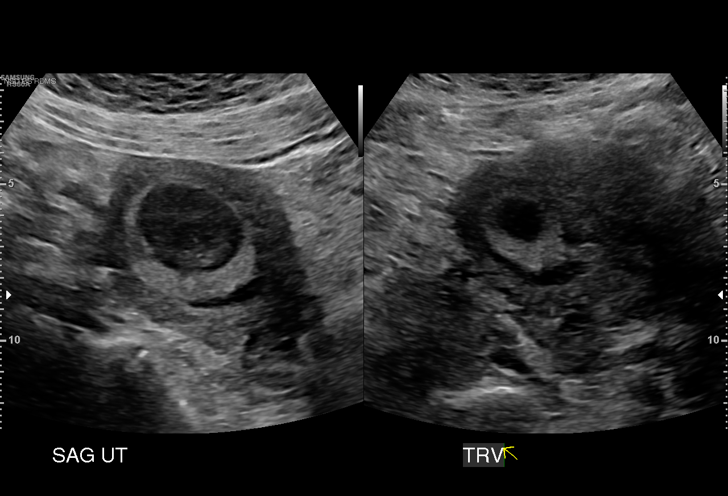
[im 15/20]
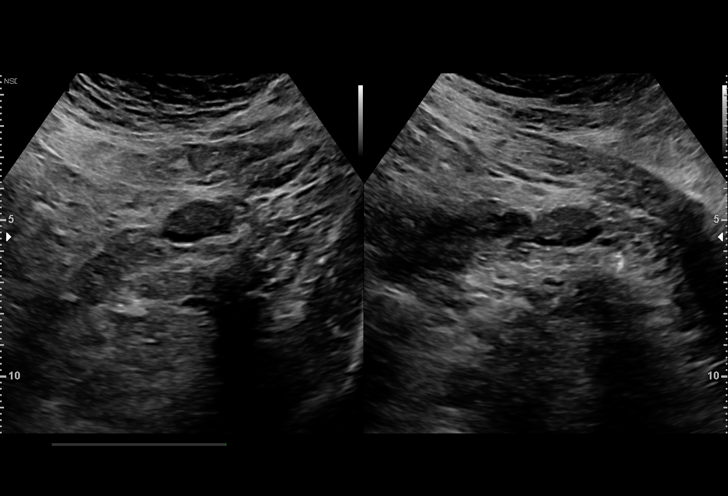
[im 16/20]
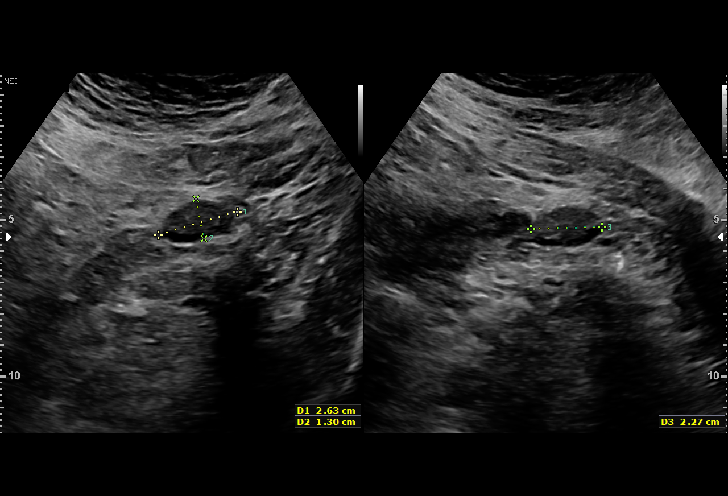
[im 17/20]
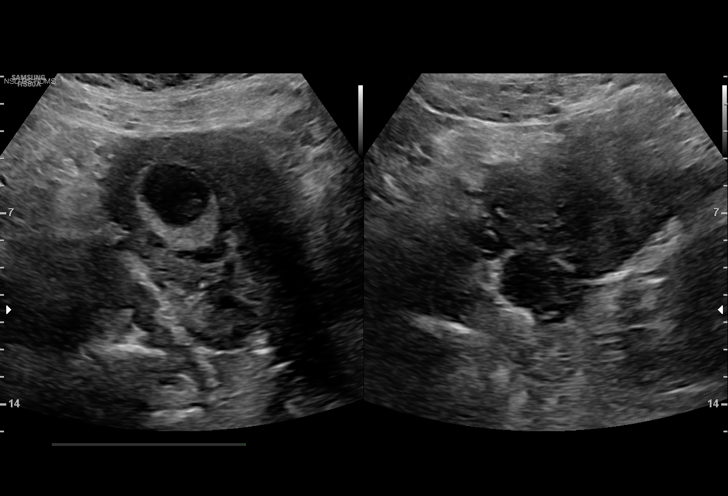
[im 19/20]
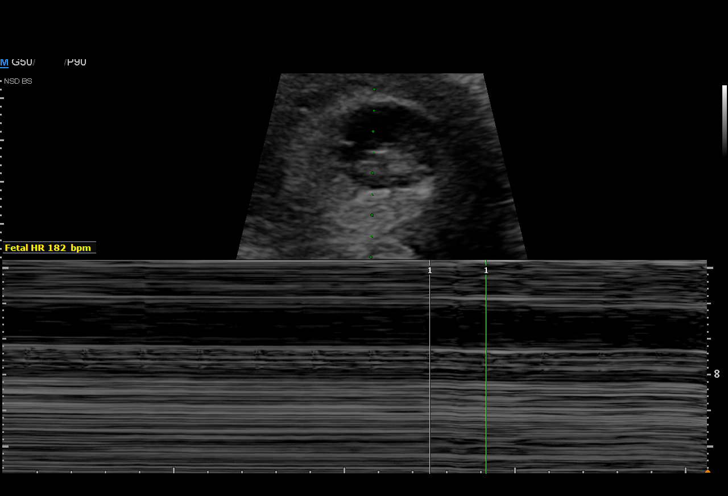
[im 20/20]
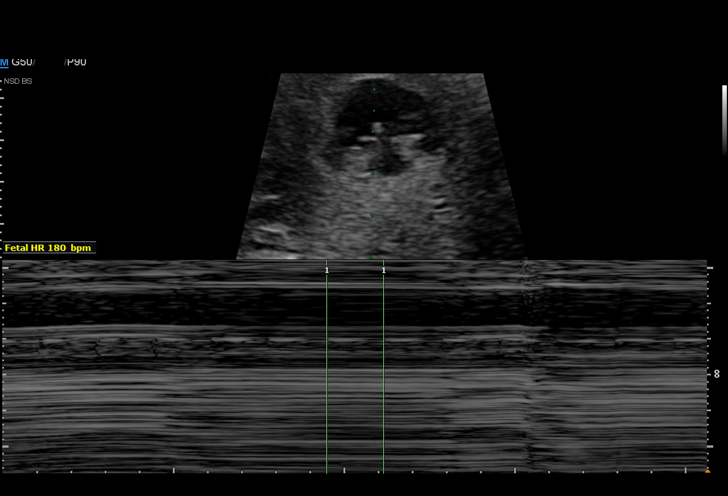

[15 of 20 positions shown; findings below may reference images not displayed]

FINDINGS: Intrauterine gestational sac: Single

Yolk sac:  Visualized

Embryo:  Visualized

Cardiac Activity: Visualized

Heart Rate: 180 bpm

CRL: 271.1 mm   9 w   4 d                  US EDC: 03/12/2020

Subchorionic hemorrhage: Small subchorionic hemorrhage along the
posterior sac.

Maternal uterus/adnexae: Ovaries are within normal limits. Left
ovary measures 2.6 x 1.3 x 2.3 cm. Right ovary measures 3.1 x 2.3 x
2.8 cm. No significant free fluid
IMPRESSION: 1. Single viable intrauterine pregnancy as above.
2. Small subchorionic hemorrhage

## 2022-11-25 ENCOUNTER — Ambulatory Visit
Admission: EM | Admit: 2022-11-25 | Discharge: 2022-11-25 | Disposition: A | Discharge status: Home / Self Care | Payer: Federal, State, Local not specified - PPO | Attending: Emergency Medicine | Admitting: Emergency Medicine

## 2022-11-25 DIAGNOSIS — J111 Influenza due to unidentified influenza virus with other respiratory manifestations: Secondary | ICD-10-CM

## 2022-11-25 MED ORDER — OSELTAMIVIR PHOSPHATE 75 MG PO CAPS: 75.0000 mg | ORAL_CAPSULE | Freq: Two times a day (BID) | ORAL | 0 refills | Status: AC

## 2022-11-25 NOTE — Discharge Instructions (Addendum)
We are unable to provide definitive testing for influenza today.  There appears to be a Producer, television/film/video of influenza tests for reasons I do not fully understand.  Per our protocol, patients who present with symptoms of influenza-like illness should be treated as if it is influenza with Tamiflu.  I sent a prescription to your pharmacy.  Please take 1 capsule twice daily for the next 5 days.  You are welcome to continue using Zyrtec, Flonase and ibuprofen as needed.  If you feel that Robitussin helps sinus drainage, you are welcome to try that as well.  Please follow-up in the next 5 to 7 days if you have worsening symptoms or if you are simply just not feeling better.  Thank you for visiting urgent care today.

## 2022-11-25 NOTE — ED Provider Notes (Signed)
[facility]    CSN: [id number] Arrival date & time: 11/25/22  4431    HISTORY   Chief Complaint  Patient presents with   Facial Pain   Headache   Fever   Chills   HPI Laura Wilkerson is a pleasant, 32 y.o. female who presents to urgent care today. Pt c/o sinus pressure, headaches, fever 100.44F and chills that began 2 days ago.  Patient states she has been taking Zyrtec, Sudafed, Motrin and Benadryl without relief.  Patient has normal vital signs on arrival today with the exception of a slightly elevated blood pressure likely secondary to taking Sudafed.  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  Patient denies nausea, vomiting, diarrhea, cough.    Past Medical History:  Diagnosis Date   Anxiety    History of pre-eclampsia    Infection    UTI   Preeclampsia, third trimester    Patient Active Problem List   Diagnosis Date Noted   Status post repeat low transverse cesarean section 03/10/2020   Past Surgical History:  Procedure Laterality Date   CESAREAN SECTION     CESAREAN SECTION N/A 03/10/2020   Procedure: CESAREAN SECTION;  Surgeon: Huel Cote, MD;  Location: [facility] LD ORS;  Service: Obstetrics;  Laterality: N/A;  Heather,  RNFA   OB History     Gravida  2   Para  2   Term  1   Preterm      AB      Living  2      SAB      IAB      Ectopic      Multiple  0   Live Births  2          Home Medications    Prior to Admission medications   Medication Sig Start Date End Date Taking? Authorizing Provider  ibuprofen (ADVIL) 800 MG tablet Take 1 tablet (800 mg total) by mouth every 8 (eight) hours. 03/12/20   Shivaji, Valerie Roys, MD  Prenatal Vit-Fe Fumarate-FA (PRENATAL MULTIVITAMIN) TABS tablet Take 1 tablet by mouth daily at 12 noon.    [provider]    Family History Family History  Problem Relation Age of Onset   Thyroid disease Mother    Social  History Social History   Tobacco Use   Smoking status: Never   Smokeless tobacco: Never  Vaping Use   Vaping Use: Never used  Substance Use Topics   Alcohol use: Never   Drug use: Never   Allergies   Patient has no known allergies.  Review of Systems Review of Systems Pertinent findings revealed after performing a 14 point review of systems has been noted in the history of present illness.  Physical Exam Triage Vital Signs ED Triage Vitals  Enc Vitals Group     BP 09/18/21 0827 (!) 147/82     Pulse Rate 09/18/21 0827 72     Resp 09/18/21 0827 18     Temp 09/18/21 0827 98.3 F (36.8 C)     Temp Source 09/18/21 0827 Oral     SpO2 09/18/21 0827 98 %     Weight --      Height --      Head Circumference --      Peak Flow --      Pain Score 09/18/21 0826 5     Pain Loc --      Pain  Edu? --      Excl. in GC? --   No data found.  Updated Vital Signs BP (!) 141/81 (BP Location: Right Arm)   Pulse 88   Temp 98.3 F (36.8 C) (Oral)   Resp 16   SpO2 96%   Physical Exam Constitutional:      Appearance: She is ill-appearing.  HENT:     Head: Normocephalic and atraumatic.     Salivary Glands: Right salivary gland is not diffusely enlarged or tender. Left salivary gland is not diffusely enlarged or tender.     Right Ear: Tympanic membrane, ear canal and external ear normal.     Left Ear: Tympanic membrane, ear canal and external ear normal.     Nose: Congestion and rhinorrhea present. Rhinorrhea is clear.     Right Sinus: No maxillary sinus tenderness or frontal sinus tenderness.     Left Sinus: No maxillary sinus tenderness.     Mouth/Throat:     Mouth: Mucous membranes are moist.     Pharynx: Pharyngeal swelling, posterior oropharyngeal erythema and uvula swelling present.     Tonsils: No tonsillar exudate. 0 on the right. 0 on the left.  Cardiovascular:     Rate and Rhythm: Normal rate and regular rhythm.     Pulses: Normal pulses.  Pulmonary:     Effort:  Pulmonary effort is normal. No accessory muscle usage, prolonged expiration or respiratory distress.     Breath sounds: No stridor. No wheezing, rhonchi or rales.     Comments: Turbulent breath sounds throughout without wheeze, rale, rhonchi. Abdominal:     General: Abdomen is flat. Bowel sounds are normal.     Palpations: Abdomen is soft.  Musculoskeletal:        General: Normal range of motion.  Lymphadenopathy:     Cervical: Cervical adenopathy present.     Right cervical: Superficial cervical adenopathy and posterior cervical adenopathy present.     Left cervical: Superficial cervical adenopathy and posterior cervical adenopathy present.  Skin:    General: Skin is warm and dry.  Neurological:     General: No focal deficit present.     Mental Status: She is alert and oriented to person, place, and time.     Motor: Motor function is intact.     Coordination: Coordination is intact.     Gait: Gait is intact.     Deep Tendon Reflexes: Reflexes are normal and symmetric.  Psychiatric:        Attention and Perception: Attention and perception normal.        Mood and Affect: Mood and affect normal.        Speech: Speech normal.        Behavior: Behavior normal. Behavior is cooperative.        Thought Content: Thought content normal.     Visual Acuity Right Eye Distance:   Left Eye Distance:   Bilateral Distance:    Right Eye Near:   Left Eye Near:    Bilateral Near:     UC Couse / Diagnostics / Procedures:     Radiology No results found.  Procedures Procedures (including critical care time) EKG  Pending results:  Labs Reviewed - No data to display  Medications Ordered in UC: Medications - No data to display  UC Diagnoses / Final Clinical Impressions(s)   I have reviewed the triage vital signs and the nursing notes.  Pertinent labs & imaging results that were available during my care of the  patient were reviewed by me and considered in my medical decision making  (see chart for details).    Final diagnoses:  Influenza-like illness   Patient advised that we are unable to test her for flu.  Patient also advised if she has a home COVID test, she is certainly welcome to take 1.  Patient advised that I recommend we treat her empirically for presumed influenza based on physical exam findings and symptoms described to her today.  Tamiflu sent to pharmacy.  Patient advised she can continue over-the-counter cold and flu medications for symptomatic relief.  Return precautions advised. Please see discharge instructions below for further details of plan of care as provided to patient. ED Prescriptions     Medication Sig Dispense Auth. Provider   oseltamivir (TAMIFLU) 75 MG capsule Take 1 capsule (75 mg total) by mouth every 12 (twelve) hours for 5 days. 10 capsule Lynden Oxford Scales, PA-C      PDMP not reviewed this encounter.  Disposition Upon Discharge:  Condition: stable for discharge home Home: take medications as prescribed; routine discharge instructions as discussed; follow up as advised.  Patient presented with an acute illness with associated systemic symptoms and significant discomfort requiring urgent management. In my opinion, this is a condition that a prudent lay person (someone who possesses an average knowledge of health and medicine) may potentially expect to result in complications if not addressed urgently such as respiratory distress, impairment of bodily function or dysfunction of bodily organs.   Routine symptom specific, illness specific and/or disease specific instructions were discussed with the patient and/or caregiver at length.   As such, the patient has been evaluated and assessed, work-up was performed and treatment was provided in alignment with urgent care protocols and evidence based medicine.  Patient/parent/caregiver has been advised that the patient may require follow up for further testing and treatment if the symptoms  continue in spite of treatment, as clinically indicated and appropriate.  If the patient was tested for COVID-19, Influenza and/or RSV, then the patient/parent/guardian was advised to isolate at home pending the results of his/her diagnostic coronavirus test and potentially longer if they're positive. I have also advised pt that if his/her COVID-19 test returns positive, it's recommended to self-isolate for at least 10 days after symptoms first appeared AND until fever-free for 24 hours without fever reducer AND other symptoms have improved or resolved. Discussed self-isolation recommendations as well as instructions for household member/close contacts as per the [facility] and [place] DHHS, and also gave patient the [place] packet with this information.  Patient/parent/caregiver has been advised to return to the [facility] or PCP in 3-5 days if no better; to PCP or the Emergency Department if new signs and symptoms develop, or if the current signs or symptoms continue to change or worsen for further workup, evaluation and treatment as clinically indicated and appropriate  The patient will follow up with their current PCP if and as advised. If the patient does not currently have a PCP we will assist them in obtaining one.   The patient may need specialty follow up if the symptoms continue, in spite of conservative treatment and management, for further workup, evaluation, consultation and treatment as clinically indicated and appropriate.  Patient/parent/caregiver verbalized understanding and agreement of plan as discussed.  All questions were addressed during visit.  Please see discharge instructions below for further details of plan.  Discharge Instructions:   Discharge Instructions      We are unable to provide definitive testing for  influenza today.  There appears to be a Producer, television/film/video of influenza tests for reasons I do not fully understand.  Per our protocol, patients who present with symptoms of  influenza-like illness should be treated as if it is influenza with Tamiflu.  I sent a prescription to your pharmacy.  Please take 1 capsule twice daily for the next 5 days.  You are welcome to continue using Zyrtec, Flonase and ibuprofen as needed.  If you feel that Robitussin helps sinus drainage, you are welcome to try that as well.  Please follow-up in the next 5 to 7 days if you have worsening symptoms or if you are simply just not feeling better.  Thank you for visiting urgent care today.      This office note has been dictated using Museum/gallery curator.  Unfortunately, this method of dictation can sometimes lead to typographical or grammatical errors.  I apologize for your inconvenience in advance if this occurs.  Please do not hesitate to reach out to me if clarification is needed.      Lynden Oxford Scales, [place] 11/25/22 [phone]

## 2022-11-25 NOTE — ED Triage Notes (Signed)
Pt c/o sinus pressure, headaches, fever 100.17F and chills that began 2 days to now.  Home interventions: motrin, sudafed, benadryl, zyrtec

## 2023-06-17 ENCOUNTER — Other Ambulatory Visit: Payer: Self-pay | Admitting: Obstetrics and Gynecology

## 2023-06-17 DIAGNOSIS — N6320 Unspecified lump in the left breast, unspecified quadrant: Secondary | ICD-10-CM

## 2023-07-19 ENCOUNTER — Ambulatory Visit: Admission: RE | Admit: 2023-07-19 | Payer: Federal, State, Local not specified - PPO | Source: Ambulatory Visit

## 2023-07-19 ENCOUNTER — Other Ambulatory Visit: Payer: Self-pay | Admitting: Obstetrics and Gynecology

## 2023-07-19 ENCOUNTER — Ambulatory Visit
Admission: RE | Admit: 2023-07-19 | Discharge: 2023-07-19 | Disposition: A | Discharge status: Home / Self Care | Payer: Federal, State, Local not specified - PPO | Source: Ambulatory Visit | Attending: Obstetrics and Gynecology | Admitting: Obstetrics and Gynecology

## 2023-07-19 DIAGNOSIS — N6320 Unspecified lump in the left breast, unspecified quadrant: Secondary | ICD-10-CM

## 2024-12-08 ENCOUNTER — Encounter: Payer: Self-pay | Admitting: Emergency Medicine

## 2024-12-08 ENCOUNTER — Ambulatory Visit: Admission: EM | Admit: 2024-12-08 | Discharge: 2024-12-08 | Disposition: A | Discharge status: Home / Self Care

## 2024-12-08 DIAGNOSIS — R300 Dysuria: Secondary | ICD-10-CM | POA: Diagnosis present

## 2024-12-08 LAB — POCT URINE DIPSTICK
Bilirubin, UA: NEGATIVE
Blood, UA: NEGATIVE
Glucose, UA: NEGATIVE mg/dL
Ketones, POC UA: NEGATIVE mg/dL
Leukocytes, UA: NEGATIVE
Nitrite, UA: NEGATIVE
POC PROTEIN,UA: NEGATIVE
Spec Grav, UA: 1.02
Urobilinogen, UA: 0.2 U/dL
pH, UA: 7

## 2024-12-08 MED ORDER — PHENAZOPYRIDINE HCL 200 MG PO TABS: 200.0000 mg | ORAL_TABLET | Freq: Three times a day (TID) | ORAL | 0 refills | Status: DC

## 2024-12-08 MED ORDER — SULFAMETHOXAZOLE-TRIMETHOPRIM 800-160 MG PO TABS: 1.0000 | ORAL_TABLET | Freq: Two times a day (BID) | ORAL | 0 refills | Status: AC

## 2024-12-08 NOTE — ED Provider Notes (Signed)
 Laura Wilkerson    CSN: [id number] Arrival date & time: 12/08/24  9160      History   Chief Complaint Chief Complaint  Patient presents with   Dysuria    HPI Laura Wilkerson is a 34 y.o. female.    Laura Wilkerson presents today with concern for urinary tract infection.  She reports that her symptoms (dysuria, frequency, and urgency ) began 1 week ago.  She was seen via telemedicine and prescribed Macrobid.  She reports that on day 1 and 2 of Macrobid she was feeling better, but symptoms returned to baseline on day 3.  She did complete the Macrobid as prescribed.  She continues to have urinary frequency, urgency, and dysuria associated with pelvic discomfort while sitting or standing. Laura Wilkerson denies any back or abdominal pain, blood in the urine, changes in vaginal discharge, fever, chills, nausea, or vomiting.  She reports that her urine has been mostly clear. Laura Wilkerson has been trying to drink plenty of water  to alleviate symptoms.  The history is provided by the patient.  Dysuria Associated symptoms: no abdominal pain, no flank pain, no nausea, no vaginal discharge and no vomiting     Past Medical History:  Diagnosis Date   Anxiety    History of pre-eclampsia    Infection    UTI   Preeclampsia, third trimester     Patient Active Problem List   Diagnosis Date Noted   Status post repeat low transverse cesarean section 03/10/2020    Past Surgical History:  Procedure Laterality Date   CESAREAN SECTION     CESAREAN SECTION N/A 03/10/2020   Procedure: CESAREAN SECTION;  Surgeon: Estelle Service, MD;  Location: [facility];  Service: Obstetrics;  Laterality: N/A;  Heather,  RNFA    OB History     Gravida  2   Para  2   Term  1   Preterm      AB      Living  2      SAB      IAB      Ectopic      Multiple  0   Live Births  2            Home Medications    Prior to Admission medications  Medication Sig Start Date End Date Taking?  Authorizing Provider  phenazopyridine  (PYRIDIUM ) 200 MG tablet Take 1 tablet (200 mg total) by mouth 3 (three) times daily. 12/08/24  Yes Leatrice Vernell HERO, NP  sulfamethoxazole -trimethoprim  (BACTRIM  DS) 800-160 MG tablet Take 1 tablet by mouth 2 (two) times daily for 3 days. 12/08/24 12/11/24 Yes Leatrice Vernell HERO, NP  ibuprofen  (ADVIL ) 800 MG tablet Take 1 tablet (800 mg total) by mouth every 8 (eight) hours. 03/12/20   Shivaji, Lavonia HERO, MD  nitrofurantoin, macrocrystal-monohydrate, (MACROBID) 100 MG capsule Take 100 mg by mouth 2 (two) times daily.    [provider]  Prenatal Vit-Fe Fumarate-FA (PRENATAL MULTIVITAMIN) TABS tablet Take 1 tablet by mouth daily at 12 noon.    [provider]    Family History Family History  Problem Relation Age of Onset   Thyroid disease Mother     Social History Social History[1]   Allergies   Patient has no known allergies.   Review of Systems Review of Systems  Constitutional: Negative.   Gastrointestinal:  Negative for abdominal distention, abdominal pain, diarrhea, nausea and vomiting.  Genitourinary:  Positive for dysuria, frequency and urgency. Negative for decreased urine volume,  difficulty urinating, flank pain, hematuria, pelvic pain and vaginal discharge.  Musculoskeletal:  Negative for back pain.     Physical Exam Triage Vital Signs ED Triage Vitals [12/08/24 0845]  Encounter Vitals Group     BP 138/83     Girls Systolic BP Percentile      Girls Diastolic BP Percentile      Boys Systolic BP Percentile      Boys Diastolic BP Percentile      Pulse Rate 72     Resp 17     Temp 97.8 F (36.6 C)     Temp Source Oral     SpO2 97 %     Weight      Height      Head Circumference      Peak Flow      Pain Score      Pain Loc      Pain Education      Exclude from Growth Chart    No data found.  Updated Vital Signs BP 138/83 (BP Location: Right Arm)   Pulse 72   Temp 97.8 F (36.6 C) (Oral)   Resp 17    SpO2 97%   Breastfeeding No   Visual Acuity Right Eye Distance:   Left Eye Distance:   Bilateral Distance:    Right Eye Near:   Left Eye Near:    Bilateral Near:     Physical Exam Vitals and nursing note reviewed.  Constitutional:      General: She is not in acute distress.    Appearance: Normal appearance. She is normal weight. She is not toxic-appearing.  Eyes:     Conjunctiva/sclera: Conjunctivae normal.  Cardiovascular:     Rate and Rhythm: Normal rate and regular rhythm.     Heart sounds: Normal heart sounds.  Pulmonary:     Effort: Pulmonary effort is normal.     Breath sounds: Normal breath sounds and air entry.  Abdominal:     General: Abdomen is flat. Bowel sounds are normal.     Palpations: Abdomen is soft.     Tenderness: There is no abdominal tenderness. There is no right CVA tenderness or left CVA tenderness.  Skin:    General: Skin is warm and dry.  Neurological:     Mental Status: She is alert and oriented to person, place, and time.  Psychiatric:        Mood and Affect: Mood normal.        Behavior: Behavior normal.      Wilkerson Treatments / Results  Labs (all labs ordered are listed, but only abnormal results are displayed) Labs Reviewed  POCT URINE DIPSTICK - Abnormal; Notable for the following components:      Result Value   Clarity, UA cloudy (*)    All other components within normal limits  URINE CULTURE    EKG   Radiology No results found.  Procedures Procedures (including critical care time)  Medications Ordered in Wilkerson Medications - No data to display  Initial Impression / Assessment and Plan / Wilkerson Course  I have reviewed the triage vital signs and the nursing notes.  Pertinent labs & imaging results that were available during my care of the patient were reviewed by me and considered in my medical decision making (see chart for details).    Dysuria Though urinalysis today did not demonstrate any significant abnormalities, I feel  it may be skewed due to recent Macrobid use.  Given improvement and resurgence  of symptoms with Macrobid, causative organism could the resistant to or have indeterminant sensitivity to Macrobid.  Today I am going to prescribe Bactrim  twice daily for 3 days, and am sending a urine culture to rule out MDRO.  I advised Shamara to complete the Bactrim  as prescribed, and today she may use phenazopyridine  every 8 hours as needed for dysuria, frequency, and urgency.  She will reassess her urinary symptoms without Azo on her first void tomorrow.  She will continue to monitor for symptoms such as fever, chills, nausea, vomiting, flank pain, and will seek medical evaluation should she experience the symptoms.  Waddell expressed comfort and appreciation of plan of care. Final Clinical Impressions(s) / Wilkerson Diagnoses   Final diagnoses:  Dysuria     Discharge Instructions      What is Acute Cystitis?  Acute cystitis is a bladder infection, usually caused by bacteria entering the urinary tract. It commonly causes urinary discomfort and irritation. Symptoms can sometimes return or persist if bacteria are resistant to the first antibiotic or if the infection was not fully cleared.  Common Symptoms - Burning or pain with urination - Frequent urge to urinate - Urinating small amounts - Pressure or discomfort in the lower abdomen - Cloudy or strong-smelling urine  Expected Recovery Most people begin to feel improvement within 24-48 hours after starting the correct antibiotic. Complete symptom resolution may take a few days. Your urine culture was sent to ensure the bacteria are sensitive to this antibiotic; treatment may be adjusted if needed.  Medications  Trimethoprim  / Sulfamethoxazole  (Bactrim  DS) - Why this works: This antibiotic stops bacteria from growing and multiplying, helping clear the bladder infection. - How to take it: Take one tablet twice daily for 3 days, spaced about 12 hours apart. Take with  a full glass of water . Finish the full course even if symptoms improve. - Common side effects: Nausea, vomiting, decreased appetite, mild rash. - Serious side effects (rare): Severe rash, shortness of breath, yellowing of the skin or eyes--seek medical care if these occur.  Phenazopyridine  (Pyridium  / AZO) -Why this works: Helps relieve burning, pain, urgency, and discomfort with urination. It does not treat the infection. -How to take it: Take as directed, usually up to 3 times daily for no more than 2 days while on antibiotics. -Important notes: Can turn urine bright orange or red and may stain clothing or contact lenses--this is expected.   Symptom Relief & Home Care - Drink plenty of fluids to help flush bacteria from the bladder - Avoid bladder irritants such as caffeine, alcohol, and spicy foods - Use a heating pad on the lower abdomen for discomfort - Urinate regularly and do not hold urine   When to Seek Medical Care - Symptoms do not improve after 48 hours of antibiotics - Symptoms worsen or return after finishing the medication - You develop a vaginal discharge, itching, or concern for yeast infection - You are unable to tolerate the medication due to side effects  Go to the Emergency Department if You Have - Fever >= 101F (38.3C) - Chills, nausea, or vomiting preventing oral intake - Back or flank pain (concern for kidney infection) - Confusion, severe weakness, or fainting  Follow-Up Your urine culture is pending. You will be contacted if results show the need to change antibiotics.       ED Prescriptions     Medication Sig Dispense Auth. Provider   sulfamethoxazole -trimethoprim  (BACTRIM  DS) 800-160 MG tablet Take 1 tablet by mouth  2 (two) times daily for 3 days. 6 tablet Leatrice Vernell HERO, NP   phenazopyridine  (PYRIDIUM ) 200 MG tablet Take 1 tablet (200 mg total) by mouth 3 (three) times daily. 6 tablet Leatrice Vernell HERO, NP      PDMP not reviewed this  encounter.    [1]  Social History Tobacco Use   Smoking status: Never   Smokeless tobacco: Never  Vaping Use   Vaping status: Never Used  Substance Use Topics   Alcohol use: Never   Drug use: Never     Leatrice Vernell HERO, NP 12/08/24 [phone]  "

## 2024-12-08 NOTE — ED Triage Notes (Signed)
 Pt c/o urinary frequency and burning with urination for over 1 week. She did tele visit on Monday and took Macrobid which helped for a few days but symptoms came back on day 3.

## 2024-12-08 NOTE — Discharge Instructions (Addendum)
 What is Acute Cystitis?  Acute cystitis is a bladder infection, usually caused by bacteria entering the urinary tract. It commonly causes urinary discomfort and irritation. Symptoms can sometimes return or persist if bacteria are resistant to the first antibiotic or if the infection was not fully cleared.  Common Symptoms - Burning or pain with urination - Frequent urge to urinate - Urinating small amounts - Pressure or discomfort in the lower abdomen - Cloudy or strong-smelling urine  Expected Recovery Most people begin to feel improvement within 24-48 hours after starting the correct antibiotic. Complete symptom resolution may take a few days. Your urine culture was sent to ensure the bacteria are sensitive to this antibiotic; treatment may be adjusted if needed.  Medications  Trimethoprim  / Sulfamethoxazole  (Bactrim  DS) - Why this works: This antibiotic stops bacteria from growing and multiplying, helping clear the bladder infection. - How to take it: Take one tablet twice daily for 3 days, spaced about 12 hours apart. Take with a full glass of water . Finish the full course even if symptoms improve. - Common side effects: Nausea, vomiting, decreased appetite, mild rash. - Serious side effects (rare): Severe rash, shortness of breath, yellowing of the skin or eyes--seek medical care if these occur.  Phenazopyridine  (Pyridium  / AZO) -Why this works: Helps relieve burning, pain, urgency, and discomfort with urination. It does not treat the infection. -How to take it: Take as directed, usually up to 3 times daily for no more than 2 days while on antibiotics. -Important notes: Can turn urine bright orange or red and may stain clothing or contact lenses--this is expected.   Symptom Relief & Home Care - Drink plenty of fluids to help flush bacteria from the bladder - Avoid bladder irritants such as caffeine, alcohol, and spicy foods - Use a heating pad on the lower abdomen for  discomfort - Urinate regularly and do not hold urine   When to Seek Medical Care - Symptoms do not improve after 48 hours of antibiotics - Symptoms worsen or return after finishing the medication - You develop a vaginal discharge, itching, or concern for yeast infection - You are unable to tolerate the medication due to side effects  Go to the Emergency Department if You Have - Fever >= 101F (38.3C) - Chills, nausea, or vomiting preventing oral intake - Back or flank pain (concern for kidney infection) - Confusion, severe weakness, or fainting  Follow-Up Your urine culture is pending. You will be contacted if results show the need to change antibiotics.

## 2024-12-09 LAB — URINE CULTURE: Culture: NO GROWTH

## 2024-12-10 ENCOUNTER — Emergency Department (HOSPITAL_BASED_OUTPATIENT_CLINIC_OR_DEPARTMENT_OTHER)

## 2024-12-10 ENCOUNTER — Ambulatory Visit (HOSPITAL_COMMUNITY): Payer: Self-pay

## 2024-12-10 ENCOUNTER — Other Ambulatory Visit: Payer: Self-pay

## 2024-12-10 ENCOUNTER — Emergency Department (HOSPITAL_BASED_OUTPATIENT_CLINIC_OR_DEPARTMENT_OTHER)
Admission: EM | Admit: 2024-12-10 | Discharge: 2024-12-10 | Disposition: A | Discharge status: Home / Self Care | Attending: Emergency Medicine | Admitting: Emergency Medicine

## 2024-12-10 ENCOUNTER — Encounter (HOSPITAL_BASED_OUTPATIENT_CLINIC_OR_DEPARTMENT_OTHER): Payer: Self-pay

## 2024-12-10 DIAGNOSIS — D72829 Elevated white blood cell count, unspecified: Secondary | ICD-10-CM | POA: Diagnosis not present

## 2024-12-10 DIAGNOSIS — R110 Nausea: Secondary | ICD-10-CM | POA: Insufficient documentation

## 2024-12-10 DIAGNOSIS — R300 Dysuria: Secondary | ICD-10-CM | POA: Insufficient documentation

## 2024-12-10 DIAGNOSIS — R1030 Lower abdominal pain, unspecified: Secondary | ICD-10-CM | POA: Insufficient documentation

## 2024-12-10 DIAGNOSIS — R718 Other abnormality of red blood cells: Secondary | ICD-10-CM | POA: Diagnosis not present

## 2024-12-10 LAB — CBC WITH DIFFERENTIAL/PLATELET
Abs Immature Granulocytes: 0.03 K/uL (ref 0.00–0.07)
Basophils Absolute: 0 K/uL (ref 0.0–0.1)
Basophils Relative: 0 %
Eosinophils Absolute: 0.2 K/uL (ref 0.0–0.5)
Eosinophils Relative: 2 %
HCT: 43.5 % (ref 36.0–46.0)
Hemoglobin: 14.9 g/dL (ref 12.0–15.0)
Immature Granulocytes: 0 %
Lymphocytes Relative: 20 %
Lymphs Abs: 2.2 K/uL (ref 0.7–4.0)
MCH: 28.8 pg (ref 26.0–34.0)
MCHC: 34.3 g/dL (ref 30.0–36.0)
MCV: 84 fL (ref 80.0–100.0)
Monocytes Absolute: 0.8 K/uL (ref 0.1–1.0)
Monocytes Relative: 8 %
Neutro Abs: 7.9 K/uL — ABNORMAL HIGH (ref 1.7–7.7)
Neutrophils Relative %: 70 %
Platelets: 289 K/uL (ref 150–400)
RBC: 5.18 MIL/uL — ABNORMAL HIGH (ref 3.87–5.11)
RDW: 11.9 % (ref 11.5–15.5)
WBC: 11.3 K/uL — ABNORMAL HIGH (ref 4.0–10.5)
nRBC: 0 % (ref 0.0–0.2)

## 2024-12-10 LAB — URINALYSIS, ROUTINE W REFLEX MICROSCOPIC
Bilirubin Urine: NEGATIVE
Glucose, UA: NEGATIVE mg/dL
Ketones, ur: NEGATIVE mg/dL
Leukocytes,Ua: NEGATIVE
Nitrite: POSITIVE — AB
Protein, ur: NEGATIVE mg/dL
Specific Gravity, Urine: 1.015 (ref 1.005–1.030)
pH: 6.5 (ref 5.0–8.0)

## 2024-12-10 LAB — BASIC METABOLIC PANEL WITH GFR
Anion gap: 13 (ref 5–15)
BUN: 10 mg/dL (ref 6–20)
CO2: 23 mmol/L (ref 22–32)
Calcium: 9.4 mg/dL (ref 8.9–10.3)
Chloride: 102 mmol/L (ref 98–111)
Creatinine, Ser: 0.78 mg/dL (ref 0.44–1.00)
GFR, Estimated: >60 mL/min
Glucose, Bld: 87 mg/dL (ref 70–99)
Potassium: 4.1 mmol/L (ref 3.5–5.1)
Sodium: 138 mmol/L (ref 135–145)

## 2024-12-10 LAB — URINALYSIS, MICROSCOPIC (REFLEX)

## 2024-12-10 LAB — WET PREP, GENITAL
Clue Cells Wet Prep HPF POC: NONE SEEN
Sperm: NONE SEEN
Trich, Wet Prep: NONE SEEN
WBC, Wet Prep HPF POC: <10
Yeast Wet Prep HPF POC: NONE SEEN

## 2024-12-10 LAB — PREGNANCY, URINE: Preg Test, Ur: NEGATIVE

## 2024-12-10 MED ORDER — PHENAZOPYRIDINE HCL 200 MG PO TABS: 200.0000 mg | ORAL_TABLET | Freq: Three times a day (TID) | ORAL | 0 refills | Status: AC

## 2024-12-10 NOTE — ED Notes (Signed)
 Patient transported to CT

## 2024-12-10 NOTE — ED Provider Notes (Signed)
 " [facility] Provider Note   CSN: [id number] Arrival date & time: 12/10/24  8195     Patient presents with: Dysuria   Laura Wilkerson is a 34 y.o. female.   Dysuria Associated symptoms: nausea   Patient is a 34 year old female to the ED today for concerns for dysuria, frequency, urgency and left flank plain that has been ongoing x 1.5 weeks, seen by telemedicine and prescribed Macrobid for possible UTI, then seen a week later by urgent care who prescribed her Bactrim  and Pyridium  despite no obvious UTI on UA.  Seen again by UC who told her to come to the emergency department for concerns for possible renal stone requesting CT.  Noting symptoms have remained persistent, noted to have mildly improved with Bactrim  but then recurred.  Additionally that she had 1 lower abdominal cramp but otherwise has not had any significant abdominal pain.  As well as noting that she did feel like something passed through the urethra but did not visualize anything.  Noted to be on birth control with IUD, not currently having periods.  Endorsing intermittent nausea  Denies fever, headache, chest pain, shortness of breath, vomiting, diarrhea, melena, hematochezia, vaginal discharge, vaginal bleeding, vaginal pain, lower leg swelling, rashes  Prior to Admission medications  Medication Sig Start Date End Date Taking? Authorizing Provider  ibuprofen  (ADVIL ) 800 MG tablet Take 1 tablet (800 mg total) by mouth every 8 (eight) hours. 03/12/20   Shivaji, Lavonia HERO, MD  nitrofurantoin, macrocrystal-monohydrate, (MACROBID) 100 MG capsule Take 100 mg by mouth 2 (two) times daily.    [provider]  phenazopyridine  (PYRIDIUM ) 200 MG tablet Take 1 tablet (200 mg total) by mouth 3 (three) times daily. 12/10/24   Beola Terrall RAMAN, PA-C  Prenatal Vit-Fe Fumarate-FA (PRENATAL MULTIVITAMIN) TABS tablet Take 1 tablet by mouth daily at 12 noon.    [provider]  sulfamethoxazole -trimethoprim  (BACTRIM  DS) 800-160 MG tablet Take 1 tablet by mouth 2 (two) times daily for 3 days. 12/08/24 12/11/24  Leatrice Vernell HERO, NP    Allergies: Patient has no known allergies.    Review of Systems  Gastrointestinal:  Positive for nausea.  Genitourinary:  Positive for dysuria and frequency.  Musculoskeletal:  Positive for back pain.  All other systems reviewed and are negative.   Updated Vital Signs BP (!) 157/81 (BP Location: Right Arm)   Pulse 93   Temp 97.9 F (36.6 C)   Resp 14   SpO2 98%   Physical Exam Vitals and nursing note reviewed.  Constitutional:      General: She is not in acute distress.    Appearance: Normal appearance. She is not ill-appearing or diaphoretic.  HENT:     Head: Normocephalic and atraumatic.  Eyes:     General: No scleral icterus.       Right eye: No discharge.        Left eye: No discharge.     Extraocular Movements: Extraocular movements intact.     Conjunctiva/sclera: Conjunctivae normal.  Cardiovascular:     Rate and Rhythm: Normal rate and regular rhythm.     Pulses: Normal pulses.     Heart sounds: Normal heart sounds. No murmur heard.    No friction rub. No gallop.  Pulmonary:     Effort: Pulmonary effort is normal. No respiratory distress.     Breath sounds: No stridor. No wheezing, rhonchi or rales.  Chest:     Chest wall: No  tenderness.  Abdominal:     General: Abdomen is flat. There is no distension.     Palpations: Abdomen is soft.     Tenderness: There is no abdominal tenderness. There is no right CVA tenderness, left CVA tenderness, guarding or rebound.  Musculoskeletal:        General: No swelling, deformity or signs of injury.     Cervical back: Normal range of motion. No rigidity.     Right lower leg: No edema.     Left lower leg: No edema.  Skin:    General: Skin is warm and dry.     Findings: No bruising, erythema or lesion.  Neurological:     General: No focal deficit  present.     Mental Status: She is alert and oriented to person, place, and time. Mental status is at baseline.     Sensory: No sensory deficit.     Motor: No weakness.  Psychiatric:        Mood and Affect: Mood normal.     (all labs ordered are listed, but only abnormal results are displayed) Labs Reviewed  URINALYSIS, ROUTINE W REFLEX MICROSCOPIC - Abnormal; Notable for the following components:      Result Value   Hgb urine dipstick TRACE (*)    Nitrite POSITIVE (*)    All other components within normal limits  URINALYSIS, MICROSCOPIC (REFLEX) - Abnormal; Notable for the following components:   Bacteria, UA FEW (*)    All other components within normal limits  CBC WITH DIFFERENTIAL/PLATELET - Abnormal; Notable for the following components:   WBC 11.3 (*)    RBC 5.18 (*)    Neutro Abs 7.9 (*)    All other components within normal limits  WET PREP, GENITAL  URINE CULTURE  PREGNANCY, URINE  BASIC METABOLIC PANEL WITH GFR  GC/CHLAMYDIA PROBE AMP ([place]) NOT AT [facility]    EKG: None  Radiology: CT Renal Stone Study Result Date: 12/10/2024 EXAM: CT ABDOMEN AND PELVIS WITHOUT CONTRAST 12/10/2024 07:12:41 PM TECHNIQUE: CT of the abdomen and pelvis was performed without the administration of intravenous contrast. Multiplanar reformatted images are provided for review. Automated exposure control, iterative reconstruction, and/or weight-based adjustment of the mA/kV was utilized to reduce the radiation dose to as low as reasonably achievable. COMPARISON: None available. CLINICAL HISTORY: Abdominal/flank pain, stone suspected; L sided flank pain with dysuria. FINDINGS: LOWER CHEST: No acute abnormality. LIVER: The liver is unremarkable. GALLBLADDER AND BILE DUCTS: Gallbladder is unremarkable. No biliary ductal dilatation. SPLEEN: No acute abnormality. PANCREAS: No acute abnormality. ADRENAL GLANDS: No acute abnormality. KIDNEYS, URETERS AND BLADDER: No stones in the kidneys or ureters.  No hydronephrosis. No perinephric or periureteral stranding. Urinary bladder is unremarkable. GI AND BOWEL: Stomach demonstrates no acute abnormality. There is no bowel obstruction. The appendix appears normal. PERITONEUM AND RETROPERITONEUM: No ascites. No free air. VASCULATURE: Aorta is normal in caliber. LYMPH NODES: No lymphadenopathy. REPRODUCTIVE ORGANS: There is an IUD in the uterus. The ovaries appear within normal limits. BONES AND SOFT TISSUES: No acute osseous abnormality. No focal soft tissue abnormality. IMPRESSION: 1. No acute findings in the abdomen or pelvis. 2. Intrauterine device in place. Electronically signed by: Greig Pique MD 12/10/2024 07:16 PM EST RP Workstation: [id number]     Procedures   Medications Ordered in the ED - No data to display  Medical Decision Making Amount and/or Complexity of Data Reviewed Labs: ordered.  This patient is a 34 year old female who presents to the ED for concern of dysuria, frequency, urgency, left-sided flank pain that has been ongoing and progressive x 1.5 weeks, treated with Macrobid, Bactrim , Pyridium , seen by urgent care twice with most recent visit urging her to come to the ED for renal scan for concerns of possible renal stone.   On physical exam, patient is in no acute distress, afebrile, alert and orient x 4, speaking in full sentences, nontachypneic, nontachycardic.  LCTAB, RRR, no abdominal tenderness to ovation, no CVA tenderness, no lower leg edema, unremarkable exam otherwise. Pelvic exam did not show any periurethral lesions.  With IUD strings notably visualized.  No CMT.  Lab work notably has elevated white count of 11.3 with elevated RBC of 5.18 likely secondary to hyper concentration with dehydration.  BMP unremarkable UA does note nitrite however with negative culture 2 days ago and 0-5 white blood cells, lower suspicion for UTI at this point.  Low suspicion for any emergent cause at this  time of cause of symptoms, CT scan also unremarkable.  Will have her continue follow-up with urology, and OB/GYN.  Providing Pyridium  in the meantime.  Providing strict return to ER precautions.  Patient vital signs have remained stable throughout the course of patient's time in the ED. Low suspicion for any other emergent pathology at this time. I believe this patient is safe to be discharged. Provided strict return to ER precautions. Patient expressed agreement and understanding of plan. All questions were answered.  Differential diagnoses prior to evaluation: The emergent differential diagnosis includes, but is not limited to, UTI, STI, kidney stone, candidiasis, cystitis. This is not an exhaustive differential.   Past Medical History / Co-morbidities / Social History: Anxiety  Additional history: Chart reviewed. Pertinent results include:   Seen by urgent care 2 days ago for dysuria, frequency and urgency.  Noted to have been seen a week prior by telemedicine and prescribed Macrobid.  Noting initial improvement but symptoms returning after 3 days.  UA did not note UTI at that time.  Sent home with Bactrim  and Pyridium .  Lab Tests/Imaging studies: I personally interpreted labs/imaging and the pertinent results include:   CBC does note an elevated white count 1.3 and elevated RBC level 5.18, likely secondary to hyper concentration from dehydration  BMP also unremarkable UPT negative CT renal does not show any acute abnormality  UA notes trace hemoglobin and is nitrite positive with few bacteria with 0-5 WBCs. Urine pregnancy is negative   I agree with the radiologist interpretation.    Medications: I ordered medication including Pyridium .  I have reviewed the patients home medicines and have made adjustments as needed.  Critical Interventions: None  Social Determinants of Health: Has good follow-up with OB/GYN  Disposition: After consideration of the diagnostic results and  the patients response to treatment, I feel that the patient would benefit from discharge and treatment as above.   emergency department workup does not suggest an emergent condition requiring admission or immediate intervention beyond what has been performed at this time. The plan is: Follow-up with urology, follow-up with OB/GYN, turn to the ER for new or worsening symptoms. The patient is safe for discharge and has been instructed to return immediately for worsening symptoms, change in symptoms or any other concerns.   Final diagnoses:  Dysuria    ED Discharge Orders          Ordered  phenazopyridine  (PYRIDIUM ) 200 MG tablet  3 times daily        12/10/24 2145               Beola Terrall RAMAN, [place] 12/10/24 2147  "

## 2024-12-10 NOTE — Discharge Instructions (Addendum)
 You are seen today for dysuria, painful urination.  Your lab work and imaging today were very reassuring that low suspicion for any emergent causes recent today.  I do have a urine culture and gonorrhea and Chlamydia test pending, they will reach out to you if positive and send in antibiotics.  Will recommend continued follow-up with urology as well as with your OB/GYN.  I provided urology's information and this at visit summary for you to call their office to get scheduled for an appointment.  As well as additionally sending in another short course of Pyridium  to use in the interim.  You can use this with Tylenol  and ibuprofen  for additional relief.  Return to the ER for any new or include uncontrolled pain, fever, blood in urine or stool, uncontrollable vomiting.

## 2024-12-10 NOTE — ED Triage Notes (Signed)
 Reports dysuria and increased urination for 1 week. Has taken 2 different abx from [facility], no relief. Nausea and chills last night

## 2024-12-10 NOTE — ED Notes (Signed)
Lab notified of urine culture add on 

## 2024-12-10 NOTE — ED Notes (Signed)

## 2024-12-11 LAB — GC/CHLAMYDIA PROBE AMP (~~LOC~~) NOT AT ARMC
Chlamydia: NEGATIVE
Comment: NEGATIVE
Comment: NORMAL
Neisseria Gonorrhea: NEGATIVE

## 2024-12-11 LAB — URINE CULTURE: Culture: NO GROWTH

## 2025-01-25 ENCOUNTER — Ambulatory Visit: Admitting: Nurse Practitioner
